# Patient Record
Sex: Male | Born: 1975 | Race: White | Hispanic: No | Marital: Married | Smoking: Current every day smoker
Health system: Southern US, Community
[De-identification: ages and names within clinical notes are randomized; demographics above are authoritative.]

## PROBLEM LIST (undated history)

## (undated) DIAGNOSIS — Z720 Tobacco use: Secondary | ICD-10-CM

## (undated) DIAGNOSIS — I255 Ischemic cardiomyopathy: Secondary | ICD-10-CM

## (undated) DIAGNOSIS — E785 Hyperlipidemia, unspecified: Secondary | ICD-10-CM

## (undated) DIAGNOSIS — I2510 Atherosclerotic heart disease of native coronary artery without angina pectoris: Secondary | ICD-10-CM

## (undated) HISTORY — PX: OTHER SURGICAL HISTORY: SHX169

## (undated) HISTORY — DX: Tobacco use: Z72.0

## (undated) HISTORY — DX: Ischemic cardiomyopathy: I25.5

## (undated) HISTORY — DX: Atherosclerotic heart disease of native coronary artery without angina pectoris: I25.10

## (undated) HISTORY — DX: Hyperlipidemia, unspecified: E78.5

## (undated) HISTORY — PX: NECK SURGERY: SHX720

---

## 1997-12-08 ENCOUNTER — Emergency Department (HOSPITAL_COMMUNITY): Admission: EM | Admit: 1997-12-08 | Discharge: 1997-12-08 | Payer: Self-pay | Admitting: Emergency Medicine

## 1997-12-10 ENCOUNTER — Encounter: Admission: RE | Admit: 1997-12-10 | Discharge: 1997-12-10 | Payer: Self-pay | Admitting: *Deleted

## 1997-12-12 ENCOUNTER — Emergency Department (HOSPITAL_COMMUNITY): Admission: EM | Admit: 1997-12-12 | Discharge: 1997-12-12 | Payer: Self-pay | Admitting: Emergency Medicine

## 1998-10-23 ENCOUNTER — Encounter: Admission: RE | Admit: 1998-10-23 | Discharge: 1998-11-01 | Payer: Self-pay | Admitting: *Deleted

## 2000-01-03 ENCOUNTER — Encounter: Payer: Self-pay | Admitting: Emergency Medicine

## 2000-01-03 ENCOUNTER — Emergency Department (HOSPITAL_COMMUNITY): Admission: EM | Admit: 2000-01-03 | Discharge: 2000-01-03 | Payer: Self-pay | Admitting: Emergency Medicine

## 2001-10-19 ENCOUNTER — Emergency Department (HOSPITAL_COMMUNITY): Admission: EM | Admit: 2001-10-19 | Discharge: 2001-10-19 | Payer: Self-pay | Admitting: Emergency Medicine

## 2001-10-19 ENCOUNTER — Encounter: Payer: Self-pay | Admitting: Emergency Medicine

## 2002-07-18 ENCOUNTER — Emergency Department (HOSPITAL_COMMUNITY): Admission: EM | Admit: 2002-07-18 | Discharge: 2002-07-18 | Payer: Self-pay | Admitting: Emergency Medicine

## 2004-04-27 ENCOUNTER — Emergency Department (HOSPITAL_COMMUNITY): Admission: EM | Admit: 2004-04-27 | Discharge: 2004-04-27 | Payer: Self-pay | Admitting: *Deleted

## 2004-04-30 ENCOUNTER — Emergency Department (HOSPITAL_COMMUNITY): Admission: EM | Admit: 2004-04-30 | Discharge: 2004-04-30 | Payer: Self-pay | Admitting: Emergency Medicine

## 2004-07-01 ENCOUNTER — Emergency Department (HOSPITAL_COMMUNITY): Admission: EM | Admit: 2004-07-01 | Discharge: 2004-07-01 | Payer: Self-pay | Admitting: Emergency Medicine

## 2004-12-09 ENCOUNTER — Emergency Department (HOSPITAL_COMMUNITY): Admission: EM | Admit: 2004-12-09 | Discharge: 2004-12-09 | Payer: Self-pay | Admitting: Emergency Medicine

## 2005-05-18 ENCOUNTER — Emergency Department (HOSPITAL_COMMUNITY): Admission: EM | Admit: 2005-05-18 | Discharge: 2005-05-18 | Payer: Self-pay | Admitting: Emergency Medicine

## 2005-07-07 ENCOUNTER — Emergency Department (HOSPITAL_COMMUNITY): Admission: EM | Admit: 2005-07-07 | Discharge: 2005-07-07 | Payer: Self-pay | Admitting: *Deleted

## 2005-11-29 ENCOUNTER — Emergency Department (HOSPITAL_COMMUNITY): Admission: EM | Admit: 2005-11-29 | Discharge: 2005-11-29 | Payer: Self-pay | Admitting: Emergency Medicine

## 2006-01-19 ENCOUNTER — Emergency Department (HOSPITAL_COMMUNITY): Admission: EM | Admit: 2006-01-19 | Discharge: 2006-01-19 | Payer: Self-pay | Admitting: Emergency Medicine

## 2006-09-04 ENCOUNTER — Emergency Department (HOSPITAL_COMMUNITY): Admission: EM | Admit: 2006-09-04 | Discharge: 2006-09-04 | Payer: Self-pay | Admitting: Emergency Medicine

## 2006-11-12 ENCOUNTER — Emergency Department (HOSPITAL_COMMUNITY): Admission: EM | Admit: 2006-11-12 | Discharge: 2006-11-12 | Payer: Self-pay | Admitting: Emergency Medicine

## 2007-01-10 ENCOUNTER — Emergency Department (HOSPITAL_COMMUNITY): Admission: EM | Admit: 2007-01-10 | Discharge: 2007-01-10 | Payer: Self-pay | Admitting: Emergency Medicine

## 2007-06-13 ENCOUNTER — Emergency Department (HOSPITAL_COMMUNITY): Admission: EM | Admit: 2007-06-13 | Discharge: 2007-06-13 | Payer: Self-pay | Admitting: Family Medicine

## 2007-11-27 ENCOUNTER — Emergency Department (HOSPITAL_COMMUNITY): Admission: EM | Admit: 2007-11-27 | Discharge: 2007-11-27 | Payer: Self-pay | Admitting: Emergency Medicine

## 2008-02-14 ENCOUNTER — Emergency Department (HOSPITAL_COMMUNITY): Admission: EM | Admit: 2008-02-14 | Discharge: 2008-02-14 | Payer: Self-pay | Admitting: Emergency Medicine

## 2008-05-18 ENCOUNTER — Emergency Department (HOSPITAL_COMMUNITY): Admission: EM | Admit: 2008-05-18 | Discharge: 2008-05-18 | Payer: Self-pay | Admitting: Family Medicine

## 2008-12-23 ENCOUNTER — Emergency Department (HOSPITAL_BASED_OUTPATIENT_CLINIC_OR_DEPARTMENT_OTHER): Admission: EM | Admit: 2008-12-23 | Discharge: 2008-12-23 | Payer: Self-pay | Admitting: Emergency Medicine

## 2009-04-09 ENCOUNTER — Emergency Department (HOSPITAL_BASED_OUTPATIENT_CLINIC_OR_DEPARTMENT_OTHER): Admission: EM | Admit: 2009-04-09 | Discharge: 2009-04-09 | Payer: Self-pay | Admitting: Emergency Medicine

## 2009-07-08 ENCOUNTER — Emergency Department (HOSPITAL_BASED_OUTPATIENT_CLINIC_OR_DEPARTMENT_OTHER): Admission: EM | Admit: 2009-07-08 | Discharge: 2009-07-08 | Payer: Self-pay | Admitting: Emergency Medicine

## 2009-09-27 ENCOUNTER — Emergency Department (HOSPITAL_COMMUNITY): Admission: EM | Admit: 2009-09-27 | Discharge: 2009-09-27 | Payer: Self-pay | Admitting: Emergency Medicine

## 2010-02-24 ENCOUNTER — Emergency Department (HOSPITAL_COMMUNITY): Admission: EM | Admit: 2010-02-24 | Discharge: 2010-02-24 | Payer: Self-pay | Admitting: Advanced Practice Midwife

## 2010-08-28 LAB — COMPREHENSIVE METABOLIC PANEL
BUN: 4 mg/dL — ABNORMAL LOW (ref 6–23)
GFR calc Af Amer: >60 mL/min (ref >60)
Glucose, Bld: 101 mg/dL — ABNORMAL HIGH (ref 70–99)
Potassium: 4.6 mEq/L (ref 3.5–5.1)
Total Protein: 8.2 g/dL (ref 6.0–8.3)

## 2010-08-28 LAB — DIFFERENTIAL
Basophils Absolute: 0 10*3/uL (ref 0.0–0.1)
Basophils Relative: 0 % (ref 0–1)
Eosinophils Relative: 1 % (ref 0–5)
Lymphocytes Relative: 10 % — ABNORMAL LOW (ref 12–46)
Lymphs Abs: 0.9 10*3/uL (ref 0.7–4.0)
Monocytes Absolute: 0.6 10*3/uL (ref 0.1–1.0)
Monocytes Relative: 6 % (ref 3–12)
Neutro Abs: 8.2 10*3/uL — ABNORMAL HIGH (ref 1.7–7.7)
Neutrophils Relative %: 84 % — ABNORMAL HIGH (ref 43–77)

## 2010-08-28 LAB — CULTURE, BLOOD (ROUTINE X 2)
Culture: NO GROWTH
Culture: NO GROWTH

## 2010-08-28 LAB — CBC
HCT: 48.3 % (ref 39.0–52.0)
Hemoglobin: 17 g/dL (ref 13.0–17.0)
MCH: 32.5 pg (ref 26.0–34.0)
RBC: 5.24 MIL/uL (ref 4.22–5.81)
WBC: 9.8 10*3/uL (ref 4.0–10.5)

## 2010-08-28 LAB — POCT CARDIAC MARKERS: Troponin i, poc: <0.05 ng/mL (ref 0.00–0.09)

## 2010-09-02 LAB — POCT CARDIAC MARKERS
Myoglobin, poc: 81.7 ng/mL (ref 12–200)
Troponin i, poc: <0.05 ng/mL (ref 0.00–0.09)

## 2010-12-31 ENCOUNTER — Emergency Department (HOSPITAL_BASED_OUTPATIENT_CLINIC_OR_DEPARTMENT_OTHER)
Admission: EM | Admit: 2010-12-31 | Discharge: 2011-01-01 | Discharge status: Against Medical Advice | Payer: Self-pay | Attending: Emergency Medicine | Admitting: Emergency Medicine

## 2010-12-31 ENCOUNTER — Emergency Department (HOSPITAL_COMMUNITY)
Admission: EM | Admit: 2010-12-31 | Discharge: 2010-12-31 | Disposition: A | Discharge status: Home / Self Care | Payer: Self-pay | Attending: Emergency Medicine | Admitting: Emergency Medicine

## 2010-12-31 ENCOUNTER — Encounter: Payer: Self-pay | Admitting: *Deleted

## 2010-12-31 DIAGNOSIS — K089 Disorder of teeth and supporting structures, unspecified: Secondary | ICD-10-CM | POA: Insufficient documentation

## 2010-12-31 NOTE — ED Notes (Signed)
Pt c/o toothache x 4 days.   

## 2010-12-31 NOTE — ED Notes (Signed)
Pt c/o toothache

## 2011-06-29 ENCOUNTER — Encounter (HOSPITAL_COMMUNITY): Payer: Self-pay | Admitting: Emergency Medicine

## 2011-06-29 ENCOUNTER — Emergency Department (HOSPITAL_COMMUNITY)
Admission: EM | Admit: 2011-06-29 | Discharge: 2011-06-29 | Disposition: A | Discharge status: Home / Self Care | Payer: Self-pay | Attending: Emergency Medicine | Admitting: Emergency Medicine

## 2011-06-29 DIAGNOSIS — F17200 Nicotine dependence, unspecified, uncomplicated: Secondary | ICD-10-CM | POA: Insufficient documentation

## 2011-06-29 DIAGNOSIS — K047 Periapical abscess without sinus: Secondary | ICD-10-CM

## 2011-06-29 DIAGNOSIS — K0889 Other specified disorders of teeth and supporting structures: Secondary | ICD-10-CM

## 2011-06-29 DIAGNOSIS — K089 Disorder of teeth and supporting structures, unspecified: Secondary | ICD-10-CM | POA: Insufficient documentation

## 2011-06-29 MED ORDER — PENICILLIN V POTASSIUM 500 MG PO TABS: 500.0000 mg | ORAL_TABLET | Freq: Four times a day (QID) | ORAL | Status: AC

## 2011-06-29 MED ORDER — KETOROLAC TROMETHAMINE 60 MG/2ML IM SOLN
60.0000 mg | Freq: Once | INTRAMUSCULAR | Status: AC
  Administered 2011-06-29: 60 mg via INTRAMUSCULAR
  Filled 2011-06-29: qty 2

## 2011-06-29 MED ORDER — PENICILLIN V POTASSIUM 500 MG PO TABS
500.0000 mg | ORAL_TABLET | Freq: Once | ORAL | Status: AC
  Administered 2011-06-29: 500 mg via ORAL
  Filled 2011-06-29: qty 1

## 2011-06-29 MED ORDER — OXYCODONE-ACETAMINOPHEN 5-325 MG PO TABS: 1.0000 | ORAL_TABLET | ORAL | Status: AC | PRN

## 2011-06-29 NOTE — ED Notes (Signed)
Pt presented to the ER with c/o dental pain, states started 3 days ago, pt took some pt medications while at home, however, effect decreased, and pain worsen. Pt showing to his right lower side.

## 2011-06-29 NOTE — ED Provider Notes (Addendum)
History     CSN: [id number]  Arrival date & time 06/29/11  0414   First MD Initiated Contact with Patient 06/29/11 [phone]      Chief Complaint  Patient presents with  . Dental Pain    (Consider location/radiation/quality/duration/timing/severity/associated sxs/prior treatment) HPI Comments: Gradual onset of lower dental pain for the last week. He is known to have severe dental disease and has had to have all of his upper teeth extracted, most of his molars and premolars extracted. Symptoms were gradual in onset, persistent, worse with palpation, not associated with fevers. He has had hydrocodone prior to arrival with minimal improvement. He does not evident  The history is provided by the patient and the spouse.    History reviewed. No pertinent past medical history.  Past Surgical History  Procedure Date  . Neck surgery     "whole in the throat" fixed    History reviewed. No pertinent family history.  History  Substance Use Topics  . Smoking status: Current Everyday Smoker -- 0.5 packs/day  . Smokeless tobacco: Not on file  . Alcohol Use: No      Review of Systems  Constitutional: Negative for fever and chills.  HENT: Positive for dental problem. Negative for sore throat, facial swelling, trouble swallowing and voice change.        Toothache  Gastrointestinal: Negative for nausea and vomiting.    Allergies  Review of patient's allergies indicates no known allergies.  Home Medications   Current Outpatient Rx  Name Route Sig Dispense Refill  . HYDROCODONE-ACETAMINOPHEN 5-325 MG PO TABS Oral Take 2 tablets by mouth every 6 (six) hours as needed. Pain    . OXYCODONE-ACETAMINOPHEN 5-325 MG PO TABS Oral Take 1 tablet by mouth every 4 (four) hours as needed for pain. May take 2 tablets PO q 6 hours for severe pain - Do not take with Tylenol as this tablet already contains tylenol 15 tablet 0  . PENICILLIN V POTASSIUM 500 MG PO TABS Oral Take 1 tablet (500 mg total) by  mouth 4 (four) times daily. 40 tablet 0    BP 131/78  Pulse 85  Temp(Src) 98 F (36.7 C) (Oral)  Resp 18  SpO2 98%  Physical Exam  Nursing note and vitals reviewed. Constitutional: He appears well-developed and well-nourished. No distress.  HENT:  Head: Normocephalic and atraumatic.  Mouth/Throat: Oropharynx is clear and moist. No oropharyngeal exudate.       Dental Disease, diffuse terrible appearing teeth, deep caries, only the front central and lateral incisors and canines present, no periapical abscesses visualized, no fluctuance or induration. No external swelling of the jaw. Able to open his mouth without any trismus.  Eyes: Conjunctivae are normal. No scleral icterus.  Neck: Normal range of motion. Neck supple. No thyromegaly present.  Cardiovascular: Normal rate and regular rhythm.   Pulmonary/Chest: Effort normal and breath sounds normal.  Lymphadenopathy:    He has no cervical adenopathy.  Neurological: He is alert.  Skin: Skin is warm and dry. No rash noted. He is not diaphoretic.    ED Course  Procedures (including critical care time)  Labs Reviewed - No data to display No results found.   1. Toothache   2. Dental abscess       MDM  Toradol intramuscular given, penicillin given, home with prescriptions and followup with dental surgery. The phone number. followup instructions given including fever swelling pain and vomiting to return to the emergency department.  Discharge Prescriptions include:  #1  Percocet #2 penicillin        Vida Roller, MD 06/29/11 1308  Vida Roller, MD 06/29/11 [phone]

## 2011-06-30 ENCOUNTER — Encounter (HOSPITAL_COMMUNITY): Payer: Self-pay | Admitting: *Deleted

## 2011-06-30 ENCOUNTER — Emergency Department (HOSPITAL_COMMUNITY)
Admission: EM | Admit: 2011-06-30 | Discharge: 2011-06-30 | Disposition: A | Discharge status: Home / Self Care | Payer: Self-pay | Attending: Emergency Medicine | Admitting: Emergency Medicine

## 2011-06-30 DIAGNOSIS — R6250 Unspecified lack of expected normal physiological development in childhood: Secondary | ICD-10-CM | POA: Insufficient documentation

## 2011-06-30 DIAGNOSIS — K089 Disorder of teeth and supporting structures, unspecified: Secondary | ICD-10-CM | POA: Insufficient documentation

## 2011-06-30 DIAGNOSIS — R6884 Jaw pain: Secondary | ICD-10-CM | POA: Insufficient documentation

## 2011-06-30 DIAGNOSIS — K0262 Dental caries on smooth surface penetrating into dentin: Secondary | ICD-10-CM | POA: Insufficient documentation

## 2011-06-30 MED ORDER — KETOROLAC TROMETHAMINE 60 MG/2ML IM SOLN
60.0000 mg | Freq: Once | INTRAMUSCULAR | Status: AC
  Administered 2011-06-30: 60 mg via INTRAMUSCULAR
  Filled 2011-06-30: qty 2

## 2011-06-30 MED ORDER — OXYCODONE-ACETAMINOPHEN 5-325 MG PO TABS: 2.0000 | ORAL_TABLET | ORAL | Status: AC | PRN

## 2011-06-30 MED ORDER — HYDROMORPHONE HCL PF 2 MG/ML IJ SOLN
2.0000 mg | Freq: Once | INTRAMUSCULAR | Status: AC
  Administered 2011-06-30: 2 mg via INTRAMUSCULAR
  Filled 2011-06-30: qty 1

## 2011-06-30 MED ORDER — IBUPROFEN 800 MG PO TABS: 800.0000 mg | ORAL_TABLET | Freq: Three times a day (TID) | ORAL | Status: AC

## 2011-06-30 NOTE — ED Provider Notes (Signed)
Medical screening examination/treatment/procedure(s) were performed by non-physician practitioner and as supervising physician I was immediately available for consultation/collaboration. Devoria Albe, MD, Armando Gang   Ward Givens, MD 06/30/11 [phone]

## 2011-06-30 NOTE — ED Provider Notes (Signed)
History     CSN: [id number]  Arrival date & time 06/30/11  0102   First MD Initiated Contact with Patient 06/30/11 2004      Chief Complaint  Patient presents with  . Dental Pain    (Consider location/radiation/quality/duration/timing/severity/associated sxs/prior treatment) Patient is a 36 y.o. male presenting with tooth pain. The history is provided by the patient and a parent. The history is limited by a developmental delay.  Dental PainThe primary symptoms include mouth pain. Primary symptoms do not include fever. Primary symptoms comment: He is scheduled to have remaining teeth pulled by Dr. Teola Bradley but pain relievers at home not helping. The symptoms began 3 to 5 days ago. The symptoms are unchanged. The symptoms occur constantly.  Additional symptoms include: jaw pain. Additional symptoms do not include: facial swelling.    History reviewed. No pertinent past medical history.  Past Surgical History  Procedure Date  . Neck surgery     "whole in the throat" fixed    History reviewed. No pertinent family history.  History  Substance Use Topics  . Smoking status: Current Everyday Smoker -- 0.5 packs/day  . Smokeless tobacco: Not on file  . Alcohol Use: No      Review of Systems  Constitutional: Negative for fever and chills.  HENT: Negative for facial swelling.        Complains of lower anterior jaw pain.  Gastrointestinal: Negative.   Skin: Negative.     Allergies  Penicillins  Home Medications   Current Outpatient Rx  Name Route Sig Dispense Refill  . IBUPROFEN 200 MG PO TABS Oral Take 200 mg by mouth every 6 (six) hours as needed.    . OXYCODONE-ACETAMINOPHEN 5-325 MG PO TABS Oral Take 1 tablet by mouth every 4 (four) hours as needed for pain. May take 2 tablets PO q 6 hours for severe pain - Do not take with Tylenol as this tablet already contains tylenol 15 tablet 0  . PENICILLIN V POTASSIUM 500 MG PO TABS Oral Take 1 tablet (500 mg total) by mouth 4  (four) times daily. 40 tablet 0    BP 133/78  Pulse 67  Temp(Src) 98 F (36.7 C) (Oral)  Resp 20  Physical Exam  Constitutional: He is oriented to person, place, and time. He appears well-developed and well-nourished.  HENT:       Only remaining teeth are lower incisors with severe erosion. No swelling. Malodorous. No facial swelling.  Neck: Normal range of motion.  Pulmonary/Chest: Effort normal.  Musculoskeletal: Normal range of motion.  Neurological: He is alert and oriented to person, place, and time.  Skin: Skin is warm and dry.  Psychiatric: He has a normal mood and affect.    ED Course  Procedures (including critical care time)  Labs Reviewed - No data to display No results found.   No diagnosis found.    MDM  The patient has appropriate plan in place for treatment. Will add ibuprofen to pain regimen, refill percocet and encourage further pain management with Dr. Barbette Merino.        Rodena Medin, PA-C 06/30/11 2021

## 2011-06-30 NOTE — ED Notes (Signed)
Pt in c/o toothache

## 2011-06-30 NOTE — ED Notes (Signed)
Pt states he is having trouble sleeping at night due to pain.pt states pain medication for his teeth is not working.pt states he can not have them remove until feb

## 2012-08-15 ENCOUNTER — Ambulatory Visit (INDEPENDENT_AMBULATORY_CARE_PROVIDER_SITE_OTHER): Payer: BC Managed Care – PPO | Admitting: Emergency Medicine

## 2012-08-15 VITALS — BP 135/91 | HR 78 | Temp 97.3°F | Resp 20 | Ht 70.0 in | Wt 162.0 lb

## 2012-08-15 DIAGNOSIS — M549 Dorsalgia, unspecified: Secondary | ICD-10-CM

## 2012-08-15 DIAGNOSIS — S335XXA Sprain of ligaments of lumbar spine, initial encounter: Secondary | ICD-10-CM

## 2012-08-15 LAB — POCT UA - MICROSCOPIC ONLY
Casts, Ur, LPF, POC: NEGATIVE
Epithelial cells, urine per micros: NEGATIVE
Yeast, UA: NEGATIVE

## 2012-08-15 LAB — POCT URINALYSIS DIPSTICK
Bilirubin, UA: NEGATIVE
Glucose, UA: NEGATIVE
Protein, UA: NEGATIVE
pH, UA: 6

## 2012-08-15 MED ORDER — CYCLOBENZAPRINE HCL 10 MG PO TABS: 10.0000 mg | ORAL_TABLET | Freq: Three times a day (TID) | ORAL | Status: DC | PRN

## 2012-08-15 MED ORDER — NAPROXEN SODIUM 550 MG PO TABS: 550.0000 mg | ORAL_TABLET | Freq: Two times a day (BID) | ORAL | Status: AC

## 2012-08-15 NOTE — Patient Instructions (Addendum)

## 2012-08-15 NOTE — Progress Notes (Signed)
[facility] [address], [place] [address] [phone]  Date:  08/15/2012   Name:  Martin Johnston   DOB:  10-10-1975   MRN:  [id number]  PCP:  No primary provider on file.    Chief Complaint: Back Pain   History of Present Illness:  Martin Johnston is a 37 y.o. very pleasant male patient who presents with the following:  Sudden onset of right flank pain last night that has worsened since.  Comes across the back to the left at times.  Waxes and wanes but never gone.  No fever or chills, nausea or vomiting.  No GI or GU symptoms.  No improvement with over the counter medications or other home remedies. No history of kidney stones.    There is no problem list on file for this patient.   History reviewed. No pertinent past medical history.  Past Surgical History  Procedure Laterality Date  . Neck surgery      "whole in the throat" fixed    History  Substance Use Topics  . Smoking status: Current Every Day Smoker -- 0.50 packs/day  . Smokeless tobacco: Not on file  . Alcohol Use: No    Family History  Problem Relation Age of Onset  . Cancer Mother   . Heart disease Father   . Diabetes Father     Allergies  Allergen Reactions  . Penicillins     UPSET STOMACH  . Vicodin (Hydrocodone-Acetaminophen)     Medication list has been reviewed and updated.  Current Outpatient Prescriptions on File Prior to Visit  Medication Sig Dispense Refill  . ibuprofen (ADVIL,MOTRIN) 200 MG tablet Take 200 mg by mouth every 6 (six) hours as needed.       No current facility-administered medications on file prior to visit.    Review of Systems:  As per HPI, otherwise negative.    Physical Examination: Filed Vitals:   08/15/12 1216  BP: 135/91  Pulse: 78  Temp: 97.3 F (36.3 C)  Resp: 20   Filed Vitals:   08/15/12 1216  Height: 5\' 10"  (1.778 m)  Weight: 162 lb (73.483 kg)   Body mass index is 23.24 kg/(m^2). Ideal Body Weight: Weight  in (lb) to have BMI = 25: 173.9  GEN: WDWN, NAD, Non-toxic, A & O x 3 HEENT: Atraumatic, Normocephalic. Neck supple. No masses, No LAD. Ears and Nose: No external deformity. CV: RRR, No M/G/R. No JVD. No thrill. No extra heart sounds. PULM: CTA B, no wheezes, crackles, rhonchi. No retractions. No resp. distress. No accessory muscle use. ABD: S, tender over right kidney, ND, +BS. No rebound. No HSM. EXTR: No c/c/e NEURO Normal gait.  PSYCH: Normally interactive. Conversant. Not depressed or anxious appearing.  Calm demeanor.  Back:  Tender right CVA  Assessment and Plan: Back pain  Lumbar strain Anaprox Flexeril Follow up as needed  Carmelina Dane, MD   Results for orders placed in visit on 08/15/12  POCT UA - MICROSCOPIC ONLY      Result Value Range   WBC, Ur, HPF, POC neg     RBC, urine, microscopic neg     Bacteria, U Microscopic trace     Mucus, UA trace     Epithelial cells, urine per micros neg     Crystals, Ur, HPF, POC neg     Casts, Ur, LPF, POC neg     Yeast, UA neg    POCT URINALYSIS DIPSTICK  Result Value Range   Color, UA yellow     Clarity, UA clear     Glucose, UA neg     Bilirubin, UA neg     Ketones, UA neg     Spec Grav, UA 1.015     Blood, UA neg     pH, UA 6.0     Protein, UA neg     Urobilinogen, UA 0.2     Nitrite, UA neg     Leukocytes, UA Negative

## 2014-10-26 ENCOUNTER — Ambulatory Visit (INDEPENDENT_AMBULATORY_CARE_PROVIDER_SITE_OTHER): Payer: Worker's Compensation | Admitting: Family Medicine

## 2014-10-26 ENCOUNTER — Ambulatory Visit: Payer: Worker's Compensation

## 2014-10-26 VITALS — BP 116/80 | HR 77 | Temp 97.7°F | Resp 18 | Ht 70.0 in | Wt 164.0 lb

## 2014-10-26 DIAGNOSIS — R0789 Other chest pain: Secondary | ICD-10-CM

## 2014-10-26 DIAGNOSIS — S29011A Strain of muscle and tendon of front wall of thorax, initial encounter: Secondary | ICD-10-CM

## 2014-10-26 DIAGNOSIS — R079 Chest pain, unspecified: Secondary | ICD-10-CM | POA: Diagnosis not present

## 2014-10-26 MED ORDER — MELOXICAM 7.5 MG PO TABS: 7.5000 mg | ORAL_TABLET | Freq: Every day | ORAL | Status: DC

## 2014-10-26 NOTE — Progress Notes (Addendum)
Subjective:  This chart was scribed for Martin StaggersJeffrey Jamont Mellin, MD by Martin Johnston, Medial Scribe. This patient was seen in room 2 and the patient's care was started at 8:47 AM.    Patient ID: Martin Johnston, male    DOB: Dec 04, 1975, 39 y.o.   MRN: [id number] Chief Complaint  Patient presents with  . Chest Pain    push injury at work  . Rib Injury    Chest Pain  Pertinent negatives include no abdominal pain, back pain, diaphoresis, fever, nausea, shortness of breath, vomiting or weakness.   HPI Comments: Martin Johnston is a 39 y.o. male who presents [facility] for evaluation of injury that occurred at work this morning. Patient works in [employer]; he reports injury sustained to left lower chest wall after removing an AC unit. Patient denies audible pop, pull or snap or pain during the activity, but reports development of pain shortly after. Patient reports exacerbated pain with movement of his left shoulder and arm and with deep inspiration. Patient denies radiation of pain into his left shoulder. Patient denies treatment prior to arrival. Patient reports tearing a muscle in his left chest 10 years ago after using a car jack. Patient denies later complications reporting full recovery from his injury.  Patient is an admitted smoker. Patient shares family of cardiac disease in his father; he is uncertain the age of onset. No alcohol, no known hx of PUD.   Prior to Admission medications   Medication Sig Start Date End Date Taking? Authorizing Provider  cyclobenzaprine (FLEXERIL) 10 MG tablet Take 1 tablet (10 mg total) by mouth 3 (three) times daily as needed for muscle spasms. Patient not taking: Reported on 10/26/2014 08/15/12   Carmelina DaneJeffery S Anderson, MD  ibuprofen (ADVIL,MOTRIN) 200 MG tablet Take 200 mg by mouth every 6 (six) hours as needed.    Historical Provider, MD   Allergies  Allergen Reactions  . Penicillins     UPSET STOMACH  . Vicodin  [Hydrocodone-Acetaminophen]      Review of Systems  Constitutional: Negative for fever and diaphoresis.  Respiratory: Negative for shortness of breath.   Cardiovascular: Positive for chest pain.  Gastrointestinal: Negative for nausea, vomiting and abdominal pain.  Musculoskeletal: Negative for back pain and neck pain.  Skin: Negative for color change and wound.  Neurological: Negative for weakness.       Objective:   Physical Exam  Constitutional: He is oriented to person, place, and time. He appears well-developed and well-nourished. No distress.  HENT:  Head: Normocephalic and atraumatic.  Eyes: EOM are normal. Pupils are equal, round, and reactive to light.  Neck: Neck supple. No JVD present. Carotid bruit is not present. No tracheal deviation present.  C spine full ROM without pain.   Cardiovascular: Normal rate, regular rhythm and normal heart sounds.   No murmur heard. Pulmonary/Chest: Effort normal and breath sounds normal. No respiratory distress. He has no rales.  Splinted inspiration, but he is clear and speaking in full sentences. No apparent pectoralis defect. Slight tenderness along left upper chest wall. No crepitus.    Abdominal: There is no tenderness.  Musculoskeletal: Normal range of motion. He exhibits no edema.  Left shoulder: AC and  joints are nontender. Clavicle nontender. Full ROM of left shoulder. Equal rotator cuff strength.    Neurological: He is alert and oriented to person, place, and time.  Skin: Skin is warm and dry.  Psychiatric: He has a normal mood and  affect. His behavior is normal.  Nursing note and vitals reviewed.   Filed Vitals:   10/26/14 0828  BP: 116/80  Pulse: 77  Temp: 97.7 F (36.5 C)  TempSrc: Oral  Resp: 18  Height: 5\' 10"  (1.778 m)  Weight: 164 lb (74.39 kg)  SpO2: 96%     [facility] reading (PRIMARY) by  Dr. Neva SeatGreene: L rib series:no pneumothorax, no apparent fracture.   EKG: sinus rhythm, no acute findings.       Assessment & Plan:   Martin Johnston is a 39 y.o. male Left sided chest pain - Plan: EKG 12-Lead, DG Ribs Unilateral W/Chest Left, meloxicam (MOBIC) 7.5 MG tablet  Chest wall pain - Plan: EKG 12-Lead, DG Ribs Unilateral W/Chest Left, meloxicam (MOBIC) 7.5 MG tablet  Chest wall muscle strain, initial encounter - Plan: meloxicam (MOBIC) 7.5 MG tablet  L chest wall strain d/t injury at work today.  No apparent rib fracture or concerns on EKG/CXR.  Work restrictions, trial of Mobic, relative rest of affected area and work restrictions and recheck in 5 days. Sooner if worse and chest pain/ER precautions given.    Meds ordered this encounter  Medications  . meloxicam (MOBIC) 7.5 MG tablet    Sig: Take 1 tablet (7.5 mg total) by mouth daily.    Dispense:  30 tablet    Refill:  0   Patient Instructions  You likely have a strained muscle in the chest wall. Try the mobic each morning (do not combine with other over the counter pain relievers), heat or ice to area as needed and avoid lifting on that side for now. Recheck next week as discussed. Return to the clinic or go to the nearest emergency room if any of your symptoms worsen or new symptoms occur.   Chest Wall Pain Chest wall pain is pain felt in or around the chest bones and muscles. It may take up to 6 weeks to get better. It may take longer if you are active. Chest wall pain can happen on its own. Other times, things like germs, injury, coughing, or exercise can cause the pain. HOME CARE   Avoid activities that make you tired or cause pain. Try not to use your chest, belly (abdominal), or side muscles. Do not use heavy weights.  Put ice on the sore area.  Put ice in a plastic bag.  Place a towel between your skin and the bag.  Leave the ice on for 15-20 minutes for the first 2 days.  Only take medicine as told by your doctor. GET HELP RIGHT AWAY IF:   You have more pain or are very uncomfortable.  You have a  fever.  Your chest pain gets worse.  You have new problems.  You feel sick to your stomach (nauseous) or throw up (vomit).  You start to sweat or feel lightheaded.  You have a cough with mucus (phlegm).  You cough up blood. MAKE SURE YOU:   Understand these instructions.  Will watch your condition.  Will get help right away if you are not doing well or get worse. Document Released: 11/18/2007 Document Revised: 08/24/2011 Document Reviewed: 01/26/2011 [facility] Patient Information 2015 [place], [place]. This information is not intended to replace advice given to you by your health care provider. Make sure you discuss any questions you have with your health care provider.     \  I personally performed the services described in this documentation, which was scribed in my presence. The recorded information  has been reviewed and considered, and addended by me as needed.

## 2014-10-26 NOTE — Patient Instructions (Signed)
You likely have a strained muscle in the chest wall. Try the mobic each morning (do not combine with other over the counter pain relievers), heat or ice to area as needed and avoid lifting on that side for now. Recheck next week as discussed. Return to the clinic or go to the nearest emergency room if any of your symptoms worsen or new symptoms occur.   Chest Wall Pain Chest wall pain is pain felt in or around the chest bones and muscles. It may take up to 6 weeks to get better. It may take longer if you are active. Chest wall pain can happen on its own. Other times, things like germs, injury, coughing, or exercise can cause the pain. HOME CARE   Avoid activities that make you tired or cause pain. Try not to use your chest, belly (abdominal), or side muscles. Do not use heavy weights.  Put ice on the sore area.  Put ice in a plastic bag.  Place a towel between your skin and the bag.  Leave the ice on for 15-20 minutes for the first 2 days.  Only take medicine as told by your doctor. GET HELP RIGHT AWAY IF:   You have more pain or are very uncomfortable.  You have a fever.  Your chest pain gets worse.  You have new problems.  You feel sick to your stomach (nauseous) or throw up (vomit).  You start to sweat or feel lightheaded.  You have a cough with mucus (phlegm).  You cough up blood. MAKE SURE YOU:   Understand these instructions.  Will watch your condition.  Will get help right away if you are not doing well or get worse. Document Released: 11/18/2007 Document Revised: 08/24/2011 Document Reviewed: 01/26/2011 [facility]Exit[facility] Patient Information 2015 [place], [place]. This information is not intended to replace advice given to you by your health care provider. Make sure you discuss any questions you have with your health care provider.

## 2014-10-31 ENCOUNTER — Ambulatory Visit (INDEPENDENT_AMBULATORY_CARE_PROVIDER_SITE_OTHER): Payer: Worker's Compensation | Admitting: Family Medicine

## 2014-10-31 VITALS — BP 132/78 | HR 75 | Temp 98.7°F | Resp 16 | Ht 71.0 in | Wt 169.4 lb

## 2014-10-31 DIAGNOSIS — R0789 Other chest pain: Secondary | ICD-10-CM | POA: Diagnosis not present

## 2014-10-31 NOTE — Progress Notes (Addendum)
Subjective:    Patient ID: Martin Johnston, male    DOB: January 21, 1976, 39 y.o.   MRN: [id number] This chart was scribed for Meredith StaggersJeffrey Luby Seamans, MD by Leona CarryG. Clay Sherrill, ED Scribe. The patient was seen in Room 14. The patient's care was started at 8:13 PM.   HPI Martin Johnston is a 39 y.o. male who is here for a follow up on an injury that occurred while moving an [employer] unit at work on May 13th. The patient suffered a chest wall strain and left-sided chest pain. X-ray did not indicate any apparent chest fractures. He was treated Meloxicam 7.5 mg QD as needed. He was advised to use heat or ice to affected area and avoid lifting.   Patient reports that he experienced soreness approximately 5 days ago, the morning after the injury occurred. He is no longer experiencing any pain. He states that he last experienced soreness yesterday.     Allergies  Allergen Reactions  . Penicillins     UPSET STOMACH  . Vicodin [Hydrocodone-Acetaminophen]    Prior to Admission medications   Medication Sig Start Date End Date Taking? Authorizing Provider  cyclobenzaprine (FLEXERIL) 10 MG tablet Take 1 tablet (10 mg total) by mouth 3 (three) times daily as needed for muscle spasms. 08/15/12  Yes Carmelina DaneJeffery S Anderson, MD  meloxicam (MOBIC) 7.5 MG tablet Take 1 tablet (7.5 mg total) by mouth daily. 10/26/14  Yes Shade FloodJeffrey R Leonor Darnell, MD    Review of Systems  Musculoskeletal: Negative for myalgias and arthralgias.       Objective:   Physical Exam  Constitutional: He is oriented to person, place, and time. He appears well-developed and well-nourished. No distress.  HENT:  Head: Normocephalic and atraumatic.  Eyes: Conjunctivae and EOM are normal.  Neck: Neck supple. No tracheal deviation present.  Cardiovascular: Normal rate, regular rhythm and normal heart sounds.   Pulmonary/Chest: Effort normal and breath sounds normal. No respiratory distress. He has no wheezes. He has no rales. He exhibits no tenderness.    Musculoskeletal: Normal range of motion.  Full ROM of left shoulder. Does not reproduce pain. No pain in pectoralis with resisted butterfly testing.  Neurological: He is alert and oriented to person, place, and time.  Skin: Skin is warm and dry. No rash noted.  Psychiatric: He has a normal mood and affect. His behavior is normal.  Nursing note and vitals reviewed.  Filed Vitals:   10/31/14 1837  BP: 132/78  Pulse: 75  Temp: 98.7 F (37.1 C)  TempSrc: Oral  Resp: 16  Height: 5\' 11"  (1.803 m)  Weight: 169 lb 6.4 oz (76.839 kg)  SpO2: 98%          Assessment & Plan:   Martin Johnston is a 39 y.o. male Chest wall pain due to injury at work 10/26/14.  - now resolved, no pain on exam and full strength.    -trial of full duty and can stop mobic if not needed.   -rechek in 1 week. Sooner if worse or pain returns.   No orders of the defined types were placed in this encounter.   Patient Instructions  As your pain has resolved, you can try to return to full duty. You can stop the meloxicam in next few days and return next Tuesday the 24th after 4pm to see Dr. Neva SeatGreene.   If pain returns prior to that time and difficulty with your usual work - return sooner for evaluation. Return to the clinic  or go to the nearest emergency room if any of your symptoms worsen or new symptoms occur.     I personally performed the services described in this documentation, which was scribed in my presence. The recorded information has been reviewed and considered, and addended by me as needed.

## 2014-10-31 NOTE — Patient Instructions (Signed)
As your pain has resolved, you can try to return to full duty. You can stop the meloxicam in next few days and return next Tuesday the 24th after 4pm to see Dr. Neva SeatGreene.   If pain returns prior to that time and difficulty with your usual work - return sooner for evaluation. Return to the clinic or go to the nearest emergency room if any of your symptoms worsen or new symptoms occur.

## 2014-11-06 ENCOUNTER — Ambulatory Visit (INDEPENDENT_AMBULATORY_CARE_PROVIDER_SITE_OTHER): Payer: Worker's Compensation | Admitting: Family Medicine

## 2014-11-06 VITALS — BP 106/70 | HR 76 | Temp 98.1°F | Resp 16 | Ht 69.5 in | Wt 160.5 lb

## 2014-11-06 DIAGNOSIS — S29011D Strain of muscle and tendon of front wall of thorax, subsequent encounter: Secondary | ICD-10-CM | POA: Diagnosis not present

## 2014-11-06 NOTE — Patient Instructions (Signed)
As you are off pain medicines and tolerating full duty - no follow up needed at this point unless your symptoms return.  (Return to the clinic or go to the nearest emergency room if any of your symptoms worsen or new symptoms occur)

## 2014-11-06 NOTE — Progress Notes (Signed)
Subjective:    Patient ID: Martin Johnston, male    DOB: [birth year], 39 y.o.   MRN: [id number] This chart was scribed for Meredith Staggers, MD by Jolene Provost, Medical Scribe. This patient was seen in Room 13 and the patient's care was started a 7:38 PM.  HPI HPI Comments: Martin Johnston is a 39 y.o. male who presents to [facility] reporting for a follow up from a work injury. Pt's injury occurred May 13th. Pt had a left chest wall strain, with a negative x-ray. Pt was treated with mobic 7.5 QD prn. Pt had a followup May 18th with no further pain, but pt reported he was still taking mobic. Pt was told he could return to full duty at work on a trial basis, and was advised to stop mobic if not needed.   Pt states he a "just one second" of pain yesterday, but it resolved after he took a brief break. Pt states he is doing well and is able to return to full duty. Pt states he hasn't taken any pain medication for the last 6 days.   There are no active problems to display for this patient.  History reviewed. No pertinent past medical history. Past Surgical History  Procedure Laterality Date  . Neck surgery      "whole in the throat" fixed  . Throat sugery     Allergies  Allergen Reactions  . Penicillins     UPSET STOMACH  . Vicodin [Hydrocodone-Acetaminophen]    Prior to Admission medications   Not on File   History   Social History  . Marital Status: Married    Spouse Name: N/A  . Number of Children: N/A  . Years of Education: N/A   Occupational History  . Not on file.   Social History Main Topics  . Smoking status: Current Every Day Smoker -- 0.50 packs/day  . Smokeless tobacco: Former Neurosurgeon  . Alcohol Use: No  . Drug Use: No  . Sexual Activity: No   Other Topics Concern  . Not on file   Social History Narrative     Review of Systems  Constitutional: Negative for fever and chills.  Musculoskeletal: Negative for myalgias and back pain.  Skin: Negative for color  change and wound.       Objective:   Physical Exam  Constitutional: He is oriented to person, place, and time. He appears well-developed and well-nourished. No distress.  HENT:  Head: Normocephalic and atraumatic.  Eyes: Pupils are equal, round, and reactive to light.  Neck: Neck supple.  Cardiovascular: Normal rate.   Pulmonary/Chest: Effort normal. No respiratory distress.  Musculoskeletal: Normal range of motion.  Left shoulder full rotator cuff strength. ROM without pain. Full strength in bilateral upper extremities. Pectoralis without defect.   Neurological: He is alert and oriented to person, place, and time. Coordination normal.  Skin: Skin is warm and dry. He is not diaphoretic.  Psychiatric: He has a normal mood and affect. His behavior is normal.  Nursing note and vitals reviewed.    Filed Vitals:   11/06/14 1914  BP: 106/70  Pulse: 76  Temp: 98.1 F (36.7 C)  TempSrc: Oral  Resp: 16  Height: 5' 9.5" (1.765 m)  Weight: 160 lb 8 oz (72.802 kg)  SpO2: 98%        Assessment & Plan:   Martin Johnston is a 39 y.o. male Chest wall muscle strain, subsequent encounter  - resolved, tolerating full duty.  Maximal  Med improvement by hx and exam. rtc if recurrence of sx's. Understanding expressed.   No orders of the defined types were placed in this encounter.   Patient Instructions  As you are off pain medicines and tolerating full duty - no follow up needed at this point unless your symptoms return.  (Return to the clinic or go to the nearest emergency room if any of your symptoms worsen or new symptoms occur)    I personally performed the services described in this documentation, which was scribed in my presence. The recorded information has been reviewed and considered, and addended by me as needed.

## 2014-11-29 ENCOUNTER — Emergency Department (HOSPITAL_COMMUNITY)
Admission: EM | Admit: 2014-11-29 | Discharge: 2014-11-29 | Disposition: A | Discharge status: Home / Self Care | Payer: 59 | Attending: Emergency Medicine | Admitting: Emergency Medicine

## 2014-11-29 ENCOUNTER — Encounter (HOSPITAL_COMMUNITY): Payer: Self-pay | Admitting: Emergency Medicine

## 2014-11-29 DIAGNOSIS — R630 Anorexia: Secondary | ICD-10-CM | POA: Diagnosis not present

## 2014-11-29 DIAGNOSIS — Z720 Tobacco use: Secondary | ICD-10-CM | POA: Diagnosis not present

## 2014-11-29 DIAGNOSIS — Z880 Allergy status to penicillin: Secondary | ICD-10-CM | POA: Insufficient documentation

## 2014-11-29 DIAGNOSIS — F1310 Sedative, hypnotic or anxiolytic abuse, uncomplicated: Secondary | ICD-10-CM | POA: Insufficient documentation

## 2014-11-29 DIAGNOSIS — G4700 Insomnia, unspecified: Secondary | ICD-10-CM | POA: Diagnosis not present

## 2014-11-29 DIAGNOSIS — F4320 Adjustment disorder, unspecified: Secondary | ICD-10-CM | POA: Diagnosis not present

## 2014-11-29 DIAGNOSIS — F329 Major depressive disorder, single episode, unspecified: Secondary | ICD-10-CM | POA: Diagnosis not present

## 2014-11-29 DIAGNOSIS — F4321 Adjustment disorder with depressed mood: Secondary | ICD-10-CM

## 2014-11-29 DIAGNOSIS — F32A Depression, unspecified: Secondary | ICD-10-CM

## 2014-11-29 DIAGNOSIS — R45851 Suicidal ideations: Secondary | ICD-10-CM | POA: Diagnosis present

## 2014-11-29 LAB — RAPID URINE DRUG SCREEN, HOSP PERFORMED
Amphetamines: NOT DETECTED
BENZODIAZEPINES: POSITIVE — AB
Barbiturates: NOT DETECTED
COCAINE: NOT DETECTED
Opiates: NOT DETECTED
Tetrahydrocannabinol: NOT DETECTED

## 2014-11-29 LAB — COMPREHENSIVE METABOLIC PANEL
ALT: 13 U/L — ABNORMAL LOW (ref 17–63)
AST: 19 U/L (ref 15–41)
Albumin: 4.5 g/dL (ref 3.5–5.0)
Alkaline Phosphatase: 80 U/L (ref 38–126)
Anion gap: 5 (ref 5–15)
BUN: 10 mg/dL (ref 6–20)
CALCIUM: 9.3 mg/dL (ref 8.9–10.3)
CO2: 27 mmol/L (ref 22–32)
CREATININE: 1.14 mg/dL (ref 0.61–1.24)
Chloride: 106 mmol/L (ref 101–111)
GLUCOSE: 103 mg/dL — AB (ref 65–99)
Potassium: 3.4 mmol/L — ABNORMAL LOW (ref 3.5–5.1)
Sodium: 138 mmol/L (ref 135–145)
Total Bilirubin: 1.1 mg/dL (ref 0.3–1.2)
Total Protein: 7.4 g/dL (ref 6.5–8.1)

## 2014-11-29 LAB — CBC
HEMATOCRIT: 45.6 % (ref 39.0–52.0)
Hemoglobin: 16.5 g/dL (ref 13.0–17.0)
MCH: 31.8 pg (ref 26.0–34.0)
MCHC: 36.2 g/dL — ABNORMAL HIGH (ref 30.0–36.0)
MCV: 87.9 fL (ref 78.0–100.0)
PLATELETS: 283 10*3/uL (ref 150–400)
RBC: 5.19 MIL/uL (ref 4.22–5.81)
RDW: 12.5 % (ref 11.5–15.5)
WBC: 6.2 10*3/uL (ref 4.0–10.5)

## 2014-11-29 LAB — SALICYLATE LEVEL: Salicylate Lvl: <4 mg/dL (ref 2.8–30.0)

## 2014-11-29 LAB — ACETAMINOPHEN LEVEL

## 2014-11-29 LAB — ETHANOL: Alcohol, Ethyl (B): <5 mg/dL (ref <5)

## 2014-11-29 MED ORDER — ONDANSETRON HCL 4 MG PO TABS: 4.0000 mg | ORAL_TABLET | Freq: Three times a day (TID) | ORAL | Status: DC | PRN

## 2014-11-29 MED ORDER — LORAZEPAM 1 MG PO TABS: 1.0000 mg | ORAL_TABLET | Freq: Three times a day (TID) | ORAL | Status: DC | PRN

## 2014-11-29 MED ORDER — ALUM & MAG HYDROXIDE-SIMETH 200-200-20 MG/5ML PO SUSP: 30.0000 mL | ORAL | Status: DC | PRN

## 2014-11-29 MED ORDER — IBUPROFEN 200 MG PO TABS: 600.0000 mg | ORAL_TABLET | Freq: Three times a day (TID) | ORAL | Status: DC | PRN

## 2014-11-29 MED ORDER — ZOLPIDEM TARTRATE 5 MG PO TABS: 5.0000 mg | ORAL_TABLET | Freq: Every evening | ORAL | Status: DC | PRN

## 2014-11-29 NOTE — ED Notes (Signed)
Pt reports that he dos not want to stay overnight and wants to go home.  Pt is aware that they have

## 2014-11-29 NOTE — ED Notes (Signed)
Bed: [facility]36 Expected date:  Expected time:  Means of arrival:  Comments: TR4 

## 2014-11-29 NOTE — ED Notes (Signed)
Dr Lorain Childes into see

## 2014-11-29 NOTE — ED Notes (Signed)
Ambulatory to room 36 from triage

## 2014-11-29 NOTE — ED Notes (Signed)
Pt sent by PCP for suicidality with plan to hang himself.  BIB mom.  Denies HI or substance abuse.

## 2014-11-29 NOTE — ED Notes (Signed)
Up to the bathroom 

## 2014-11-29 NOTE — ED Notes (Signed)
Mom reports that she wants to take the pt home, and that he wants to go home.  She reports that he lives w/ her and is safe there, and has been all right all week until he talked to his father in law.  Mom reports that she took him to the office earlier to keep him on some medication to help and that prior to that he had never mentioned suicidal thoughts.

## 2014-11-29 NOTE — ED Notes (Signed)
Pt's mom into see 

## 2014-11-29 NOTE — ED Notes (Signed)
Dr Lorain Childes is aware  and will talk to the pt.

## 2014-11-29 NOTE — ED Notes (Signed)
Patient  denied Pain, denied SI/HI and denied Hallucinations. Patient in stable condition and he is to be discharged home with mother.

## 2014-11-29 NOTE — ED Provider Notes (Signed)
CSN: [id number]     Arrival date & time 11/29/14  1326 History   First MD Initiated Contact with Patient 11/29/14 1452     Chief Complaint  Patient presents with  . Suicidal     (Consider location/radiation/quality/duration/timing/severity/associated sxs/prior Treatment) Patient is a 39 y.o. male presenting with mental health disorder. The history is provided by the patient. No language interpreter was used.  Mental Health Problem Presenting symptoms: depression and suicidal thoughts   Presenting symptoms: no agitation, no suicidal threats and no suicide attempt   Patient accompanied by:  Family member Degree of incapacity (severity):  Moderate Onset quality:  Sudden Duration:  1 week Timing:  Constant Progression:  Unchanged Chronicity:  New Context: stressful life event   Treatment compliance:  Some of the time Relieved by:  Anti-anxiety medications Worsened by:  Nothing tried Ineffective treatments:  None tried Associated symptoms: anhedonia, appetite change and insomnia   Associated symptoms: no abdominal pain, no chest pain, no fatigue and no headaches   Associated symptoms comment:  Diarrhea   History reviewed. No pertinent past medical history. Past Surgical History  Procedure Laterality Date  . Neck surgery      "whole in the throat" fixed  . Throat sugery     Family History  Problem Relation Age of Onset  . Cancer Mother   . Heart disease Father   . Diabetes Father   . Hypertension Father    History  Substance Use Topics  . Smoking status: Current Every Day Smoker -- 0.50 packs/day  . Smokeless tobacco: Former Neurosurgeon  . Alcohol Use: No    Review of Systems  Constitutional: Positive for appetite change. Negative for fever, activity change and fatigue.  HENT: Negative for congestion, facial swelling, rhinorrhea and trouble swallowing.   Eyes: Negative for photophobia and pain.  Respiratory: Negative for cough, chest tightness and shortness of breath.    Cardiovascular: Negative for chest pain and leg swelling.  Gastrointestinal: Negative for nausea, vomiting, abdominal pain, diarrhea and constipation.  Endocrine: Negative for polydipsia and polyuria.  Genitourinary: Negative for dysuria, urgency, decreased urine volume and difficulty urinating.  Musculoskeletal: Negative for back pain and gait problem.  Skin: Negative for color change, rash and wound.  Allergic/Immunologic: Negative for immunocompromised state.  Neurological: Negative for dizziness, facial asymmetry, speech difficulty, weakness, numbness and headaches.  Psychiatric/Behavioral: Positive for suicidal ideas. Negative for confusion, decreased concentration and agitation. The patient has insomnia.       Allergies  Penicillins and Vicodin  Home Medications   Prior to Admission medications   Medication Sig Start Date End Date Taking? Authorizing Provider  ALPRAZolam (XANAX) 0.25 MG tablet Take 0.25 mg by mouth every 6 (six) hours as needed for anxiety.   Yes Historical Provider, MD  bismuth subsalicylate (PEPTO BISMOL) 262 MG chewable tablet Chew 262 mg by mouth as needed for indigestion or diarrhea or loose stools.   Yes Historical Provider, MD   BP 121/86 mmHg  Pulse 80  Temp(Src) 98 F (36.7 C) (Oral)  Resp 16  SpO2 100% Physical Exam  Constitutional: He is oriented to person, place, and time. He appears well-developed and well-nourished. No distress.  HENT:  Head: Normocephalic and atraumatic.  Mouth/Throat: No oropharyngeal exudate.  Eyes: Pupils are equal, round, and reactive to light.  Neck: Normal range of motion. Neck supple.  Cardiovascular: Normal rate, regular rhythm and normal heart sounds.  Exam reveals no gallop and no friction rub.   No murmur heard. Pulmonary/Chest: Effort  normal and breath sounds normal. No respiratory distress. He has no wheezes. He has no rales.  Abdominal: Soft. Bowel sounds are normal. He exhibits no distension and no mass.  There is no tenderness. There is no rebound and no guarding.  Musculoskeletal: Normal range of motion. He exhibits no edema or tenderness.  Neurological: He is alert and oriented to person, place, and time.  Skin: Skin is warm and dry.  Psychiatric: He exhibits a depressed mood. He expresses suicidal (passive) ideation. He expresses no suicidal plans.    ED Course  Procedures (including critical care time) Labs Review Labs Reviewed  ACETAMINOPHEN LEVEL - Abnormal; Notable for the following:    Acetaminophen (Tylenol), Serum <10 (*)    All other components within normal limits  CBC - Abnormal; Notable for the following:    MCHC 36.2 (*)    All other components within normal limits  COMPREHENSIVE METABOLIC PANEL - Abnormal; Notable for the following:    Potassium 3.4 (*)    Glucose, Bld 103 (*)    ALT 13 (*)    All other components within normal limits  URINE RAPID DRUG SCREEN, HOSP PERFORMED - Abnormal; Notable for the following:    Benzodiazepines POSITIVE (*)    All other components within normal limits  ETHANOL  SALICYLATE LEVEL    Imaging Review No results found.   EKG Interpretation None      MDM   Final diagnoses:  Depression  Grief reaction    Pt is a 39 y.o. male with Pmhx as above who presents with depression, suicidal thoughts.  Patient reports that his wife left him unexpectedly last week that he has been very upset.  He has had episodes of inconsolable crying.  He saw a PA in his PCPs office.  He stated she began asking him questions about suicidal thoughts, which hesaid he had had some thoughts, and had thought about hanging himself if he were to hurt himself.  He does not believe that he would hurt himself.  There is no history of mental illness and suicidal thoughts for self mutilative behavior.  Denies HI or AV hallucinations, his sleep.  Mood and appetite have been poor.  He tells me he does not think he would hurt himself.  His family tells me that he has  a good support system.  Using agreement to stay for a TTS consult, as I believe he would benefit from outpatient resources.  I do not feel he requires an IVC, nor do I think that he must stay for inpatient treatment, if he can contract for safety.   7:22 PM Patient has verbally and in writing contracted for safety.  He denies SI, HI or current plan to harm himself.  He states that he will be staying with his mother and agrees to return to the ED should his symptoms worsen.  He is not expressed active SI to myself or to psychiatry.  He does not want to stay for observation behavior health Hospital.  I believe patient is safe to be discharged home   Fabio Asa evaluation in the Emergency Department is complete. It has been determined that no acute conditions requiring further emergency intervention are present at this time. The patient/guardian have been advised of the diagnosis and plan. We have discussed signs and symptoms that warrant return to the ED, such as changes or worsening in symptoms, thoughts of self-harm.      Toy Cookey, MD 11/30/14 1058

## 2014-11-29 NOTE — Discharge Instructions (Signed)
°Emergency Department Resource Guide °1) Find a Doctor and Pay Out of Pocket °Although you won't have to find out who is covered by your insurance plan, it is a good idea to ask around and get recommendations. You will then need to call the office and see if the doctor you have chosen will accept you as a new patient and what types of options they offer for patients who are self-pay. Some doctors offer discounts or will set up payment plans for their patients who do not have insurance, but you will need to ask so you aren't surprised when you get to your appointment. ° °2) Contact Your Local Health Department °Not all health departments have doctors that can see patients for sick visits, but many do, so it is worth a call to see if yours does. If you don't know where your local health department is, you can check in your phone book. The CDC also has a tool to help you locate your state's health department, and many state websites also have listings of all of their local health departments. ° °3) Find a Walk-in Clinic °If your illness is not likely to be very severe or complicated, you may want to try a walk in clinic. These are popping up all over the country in pharmacies, drugstores, and shopping centers. They're usually staffed by nurse practitioners or physician assistants that have been trained to treat common illnesses and complaints. They're usually fairly quick and inexpensive. However, if you have serious medical issues or chronic medical problems, these are probably not your best option. ° °No Primary Care Doctor: °- Call [facility] at  832-8000 - they can help you locate a primary care doctor that  accepts your insurance, provides certain services, etc. °- Physician Referral Service- 1-800-533-3463 ° °Chronic Pain Problems: °Organization         Address  Phone   Notes  °[facility]  (336) 297-2271 Patients need to be referred by their primary care doctor.  ° °Medication  Assistance: °Organization         Address  Phone   Notes  °[place] [facility] [address] °[place], [place] [address] (336) 641-8030 --Must be a resident of [place] °-- Must have NO insurance coverage whatsoever (no Medicaid/ Medicare, etc.) °-- The pt. MUST have a primary care doctor that directs their care regularly and follows them in the community °  °[facility]  (866) 331-1348   °[employer]  (888) 892-1162   ° °Agencies that provide inexpensive medical care: °Organization         Address  Phone   Notes  °[facility]  (336) 832-8035   °[facility]    (336) 832-7272   °[facility] [address] °[place], [place] [address] (336) 832-4777   °[facility] [address], °[place] (336) 271-4999   °[facility]    (336) 373-0678   °[facility]    (336) 272-1050   °[facility] ° [address], [place] Phone:  (336) 832-4444, Fax:  (336) 832-4440 Hours of Operation:  9 am - 6 pm, M-F.  Also accepts Medicaid/Medicare and self-pay.  ° [facility] ° [address], [place] Phone: (336) [phone], Fax: (336) [phone]. Hours of Operation:  8:30 am - 5:30 pm, M-F.  Also accepts Medicaid and self-pay.  °[facility] [place] [address], [place] Phone: (336) 878-6027   °[facility] [address], [place], [place] (336)723-1848, Ext. 123 Mondays & Thursdays: 7-9 AM.  First 15 patients are seen on a first come, first serve basis. °  ° °Medicaid-accepting [place] Providers: ° °Organization         Address  Phone   Notes  °[facility] [address], [place] (336) 641-2100 Also accepts self-pay patients.  °[facility] [address], [place] ° (336) 856-9996   °[facility] [address], [place]  (336) 288-8857   °[facility] [address] [place] [address], [place] (336) 299-7000   °Veita Bland [address], [place]  ° (336) 373-1557 Only accepts [place] Access Medicaid patients after they have their name applied to their card.  ° °Self-Pay (no insurance) in [place]: ° °Organization         Address  Phone   Notes  °Sickle Cell Patients, [facility] [address], [place] (336) 832-1970   °[facility] [address], [place] (336) 832-4400   ° [facility] ° [address], [place] (336) 992-4800   °[facility]/Dr. Osei-Bonsu ° [address] [place] [address], [place] or [address], [place] (336) 841-8500 Phone number for both [place] and [place] locations is the same.  °[facility] [address], [place] (336) 299-0000   °[facility] [address] [place] [address], [place] or [address] (336) 852-7530 °(336) 878-2260   °[facility] [address], [place] (336) 350-1642, phone; (336) 294-5005, fax Sees patients 1st and 3rd Saturday of every month.  Must not qualify for public or private insurance (i.e. Medicaid, Medicare, [place] Health Choice, Veterans' Benefits) • Household income should be no more than 200% of the poverty level •The clinic cannot treat you if you are pregnant or think you are pregnant • Sexually transmitted diseases are not treated at the clinic.  ° ° °Dental Care: °Organization         Address  Phone  Notes  °[place] [facility] [address], [place] (336) 641-6152 Accepts children up to age 21 who are enrolled in Medicaid or [place] Health Choice; pregnant women with a Medicaid card; and children who have applied for Medicaid or [place] Health Choice, but were declined, whose parents can pay a reduced fee at time of service.  °[place]  [facility] [place]  [address], [place] (336) 641-7733 Accepts children up to age 21 who are enrolled in Medicaid or [place] Health Choice; pregnant women with a Medicaid card; and children who have applied for Medicaid or [place] Health Choice, but were declined, whose parents can pay a reduced fee at time of service.  °[facility] ° [address],  (336) 641-4533 Patients are seen by appointment only. Walk-ins are not accepted. [facility] will see patients 18 years of age and older. °Monday - Tuesday (8am-5pm) °Most Wednesdays (8:30-5pm) °$30 per visit, cash only  °[facility] ° [address], [place] (336) 641-4533 Patients are seen by appointment only. Walk-ins are not accepted. [facility] will see patients 18 years of age and older. °One   Wednesday Evening (Monthly: Volunteer Based).  $30 per visit, cash only  °[facility]  (919) 537-3737 for adults; Children under age 4, call Graduate Pediatric Dentistry at ([phone]) [phone]. Children aged 4-14, please call (919) [phone] to request a pediatric application. ° Dental services are provided in all areas of dental care including fillings, crowns and bridges, complete and partial dentures, implants, gum treatment, root canals, and extractions. Preventive care is also provided. Treatment is provided to both adults and children. °Patients are selected via a lottery and there is often a waiting list. °  °[facility] [address], °[employer] ° (336) 763-8833 [email or web address] °  °[facility] [address], [place], [place] (336)723-1848, Ext. 123 Second and Fourth Thursday of each month, opens at 6:30 AM; Clinic ends at 9 AM.  Patients are seen on a first-come first-served basis, and a limited number are seen during each clinic.  ° °[facility] ° [address], [place], [place] (336) 723-7904    Eligibility Requirements °You must have lived in [place], or [place] counties for at least the last three months. °  You cannot be eligible for state or federal sponsored healthcare insurance, including Veterans Administration, Medicaid, or Medicare. °  You generally cannot be eligible for healthcare insurance through your employer.  °  How to apply: °Eligibility screenings are held every Tuesday and Wednesday afternoon from 1:00 pm until 4:00 pm. You do not need an appointment for the interview!  °[facility] [address], [place], [place] 336-631-2330   °[facility]  336-342-8273   °[facility]  336-703-3100   °[facility]  336-570-6415   ° °Behavioral Health Resources in the Community: °Intensive Outpatient Programs °Organization         Address  Phone  Notes  °[facility] [address], [place], [place] 336-878-6098   °[facility] [address], [place], [place] 336-832-9800   °[facility] [address], [place], [place] ° 336-882-2125   °[facility] [address],  °[place], [place] 1-800-853-5163 or 336-641-4981   °Substance Abuse Resources °Organization         Address  Phone  Notes  °[facility]  336-882-2125   °[facility]  336-784-9470   °[facility]  336-285-9073   °[facility]  336-845-3988   °Residential & Outpatient Substance Abuse Program  1-800-659-3381   °Psychological Services °Organization         Address  Phone  Notes  °[facility]  336- 832-9600   °[facility]  336- 378-7881   °[facility] [address], [place] 1-800-853-5163 or 336-641-4981   ° °Mobile Crisis Teams °Organization         Address  Phone  Notes  °[facility], Mobile Crisis Care Unit  1-877-626-1772   °Assertive °[facility] ° [address]  [place], [place] 336-834-9664   °Sharon DeEsch [address] °[place] 336-554-5454   ° °Self-Help/Support Groups °Organization         Address  Phone             Notes  °[facility] - variety of support groups  336- 373-1402 Call for more information  °Narcotics Anonymous (NA), [facility] [address], °[place]   2 meetings at this location  ° °  Residential Treatment Programs °Organization         Address  Phone  Notes  °[facility] [address],    °[place]  1-866-801-8205   °[facility] ° [address], [place], [place] 704-293-8524   °[facility] [address], [place] 336-845-3988 Admissions: 8am-3pm M-F  °[facility] [address]    °[place], [place] 336-841-1104   °[facility] [address], [place], [place] 336-379-7146   °[facility] [address]  °[place], [place] 336-285-9073   °[facility] - Intensive Outpatient [address], [place], [place] 336-852-3033   °[facility] [address]  °[place], [place] 1-877-615-2722 or 336-784-9470   °[facility] [address] [place], [place] 336-227-7417 Accepts Medicaid  °[facility] [address]  °[place] 1-800-659-3381 Substance Abuse/Addiction Treatment  ° °[place] Behavioral Health Resources °Organization         Address  Phone  Notes  °[facility]  (888) 581-9988   °Julie Brannon, PhD [address] [place], [place]   (336) 349-5553 or (336) 951-0000   °[facility]   [address] °[place], [place] (336) 349-4454   °[facility] [address], [place], [place] (336) 342-8316 Insurance/Medicaid/sponsorship through [facility]  °[facility] [address]                                    [place], [place] (336) 342-8316 Therapy/tele-psych/case    °[facility] [address]  ° [place], [place] (336) 349-2233    °Dr. Arfeen  (336) 349-4544   °[facility] [place]  [employer] [place] [facility] 1) [address], [place] °2) [address], [place] °3)  [address], [place] (336) 349-3220 °(336) 342-7768 ° °(336) 342-8140   °[place] [facility] (336) 342-1394 or (336) 342-3537 (After Hours)    ° ° ° °Depression °Depression refers to feeling sad, low, down in the dumps, blue, gloomy, or empty. In general, there are two kinds of depression: °1. Normal sadness or normal grief. This kind of depression is one that we all feel from time to time after upsetting life experiences, such as the loss of a job or the ending of a relationship. This kind of depression is considered normal, is short lived, and resolves within a few days to 2 weeks. Depression experienced after the loss of a loved one (bereavement) often lasts longer than 2 weeks but normally gets better with time. °2. Clinical depression. This kind of depression lasts longer than normal sadness or normal grief or interferes with your ability to function at home, at work, and in school. It also interferes with your personal relationships. It affects almost every aspect of your life. Clinical depression is an illness. °Symptoms of depression can also be caused by conditions other than those mentioned above, such as: °· Physical illness. Some physical illnesses, including underactive thyroid gland (hypothyroidism), severe anemia, specific types of cancer, diabetes, uncontrolled seizures, heart and lung problems, strokes, and chronic pain are commonly associated with symptoms of depression. °· Side effects of some prescription medicine. In some people, certain types of medicine can cause symptoms of depression. °· Substance abuse. Abuse of alcohol and illicit drugs can cause   symptoms of depression. °SYMPTOMS °Symptoms of normal sadness and normal grief include  the following: °· Feeling sad or crying for short periods of time. °· Not caring about anything (apathy). °· Difficulty sleeping or sleeping too much. °· No longer able to enjoy the things you used to enjoy. °· Desire to be by oneself all the time (social isolation). °· Lack of energy or motivation. °· Difficulty concentrating or remembering. °· Change in appetite or weight. °· Restlessness or agitation. °Symptoms of clinical depression include the same symptoms of normal sadness or normal grief and also the following symptoms: °· Feeling sad or crying all the time. °· Feelings of guilt or worthlessness. °· Feelings of hopelessness or helplessness. °· Thoughts of suicide or the desire to harm yourself (suicidal ideation). °· Loss of touch with reality (psychotic symptoms). Seeing or hearing things that are not real (hallucinations) or having false beliefs about your life or the people around you (delusions and paranoia). °DIAGNOSIS  °The diagnosis of clinical depression is usually based on how bad the symptoms are and how long they have lasted. Your health care provider will also ask you questions about your medical history and substance use to find out if physical illness, use of prescription medicine, or substance abuse is causing your depression. Your health care provider may also order blood tests. °TREATMENT  °Often, normal sadness and normal grief do not require treatment. However, sometimes antidepressant medicine is given for bereavement to ease the depressive symptoms until they resolve. °The treatment for clinical depression depends on how bad the symptoms are but often includes antidepressant medicine, counseling with a mental health professional, or both. Your health care provider will help to determine what treatment is best for you. °Depression caused by physical illness usually goes away with appropriate medical treatment of the illness. If prescription medicine is causing depression, talk with your  health care provider about stopping the medicine, decreasing the dose, or changing to another medicine. °Depression caused by the abuse of alcohol or illicit drugs goes away when you stop using these substances. Some adults need professional help in order to stop drinking or using drugs. °SEEK IMMEDIATE MEDICAL CARE IF: °· You have thoughts about hurting yourself or others. °· You lose touch with reality (have psychotic symptoms). °· You are taking medicine for depression and have a serious side effect. °FOR MORE INFORMATION °· National Alliance on Mental Illness: www.nami.org  °· National Institute of Mental Health: www.nimh.nih.gov  °Document Released: 05/29/2000 Document Revised: 10/16/2013 Document Reviewed: 08/31/2011 °ExitCare® Patient Information ©2015 [employer]. This information is not intended to replace advice given to you by your health care provider. Make sure you discuss any questions you have with your health care provider. ° °

## 2014-11-29 NOTE — ED Notes (Signed)
Pt, being sent by PCP, c/o SI.  PCP reports Pt recently separated from wife.

## 2014-11-29 NOTE — ED Notes (Signed)
NAD, watching tv.  Pt reports that he does not have access to weapons, lives w/ his mother and feels that he would be safe of he left with her.  Pt in agreement to complete the TST assessment.

## 2014-11-29 NOTE — BH Assessment (Signed)
Assessment Note  Martin Johnston is an 39 y.o. male that presents to [facility] with his mother. Patient was referred to [facility] by a NP at [facility]. Patient stating that he went to an appointment today for increased depression. Patient reportedly told the NP that he was suicidal and wanted to hang himself. The suicidal thoughts are triggered by his wife leaving him 4-5 days ago. Patient stating that he wants to work things out with his wife. Patient feels sad that she left him and didn't tell him why. Patient sts that he has tried to contact her multiple times and she hasn't returned any of her calls. During today's assessment patient denied SI. He was able to contract for safety. Patient stated, "I could never hurt myself because I love my mother". Patient has no history of suicidal attempts. He reports a pass history of self mutilating (Age 34). He denies HI and AVH's. He also denies alcohol and drug use.   Axis I: Depressive Disorder NOS Axis II: Deferred Axis III: History reviewed. No pertinent past medical history. Axis IV: other psychosocial or environmental problems, problems related to social environment, problems with access to health care services and problems with primary support group Axis V: 31-40 impairment in reality testing  Past Medical History: History reviewed. No pertinent past medical history.  Past Surgical History  Procedure Laterality Date  . Neck surgery      "whole in the throat" fixed  . Throat sugery      Family History:  Family History  Problem Relation Age of Onset  . Cancer Mother   . Heart disease Father   . Diabetes Father   . Hypertension Father     Social History:  reports that he has been smoking.  He has quit using smokeless tobacco. He reports that he does not drink alcohol or use illicit drugs.  Additional Social History:  Alcohol / Drug Use Pain Medications: SEE MAR Prescriptions: SEE MAR Over the Counter: SEE MAR History of alcohol / drug  use?: No history of alcohol / drug abuse  CIWA: CIWA-Ar BP: 121/86 mmHg Pulse Rate: 80 COWS:    Allergies:  Allergies  Allergen Reactions  . Penicillins     UPSET STOMACH  . Vicodin [Hydrocodone-Acetaminophen]     Unknown reaction     Home Medications:  (Not in a hospital admission)  OB/GYN Status:  No LMP for male patient.  General Assessment Data Location of Assessment: [facility] ED Is this a Tele or Face-to-Face Assessment?: Face-to-Face Is this an Initial Assessment or a Re-assessment for this encounter?: Initial Assessment Marital status: Separated Maiden name:  (n/a) Is patient pregnant?: No Pregnancy Status: No Living Arrangements: Other (Comment) (patient lives with mother) Can pt return to current living arrangement?: No Admission Status: Voluntary Is patient capable of signing voluntary admission?: Yes Referral Source: Self/Family/Friend Insurance type:  Advertising copywriter )     Crisis Care Plan Living Arrangements: Other (Comment) (patient lives with mother) Name of Psychiatrist:  (No psychiatrist ) Name of Therapist:  (No therapist)  Education Status Is patient currently in school?: No Current Grade:  (n/a) Highest grade of school patient has completed:  (12th grade) Name of school:  (n/a) Contact person:  (n/a)  Risk to self with the past 6 months Suicidal Ideation: No Has patient been a risk to self within the past 6 months prior to admission? : No Suicidal Intent: No Has patient had any suicidal intent within the past 6 months prior to  admission? : No Is patient at risk for suicide?: No Suicidal Plan?: No Has patient had any suicidal plan within the past 6 months prior to admission? : No Access to Means: No What has been your use of drugs/alcohol within the last 12 months?:  (n/a) Previous Attempts/Gestures: No How many times?:  (n/a) Other Self Harm Risks:  (patient calm and cooperative ) Triggers for Past Attempts: Other (Comment) (no previous  attempts/gestures ) Intentional Self Injurious Behavior: None Family Suicide History: No Recent stressful life event(s): Other (Comment) ("My wife left me 11/24/2014 ) Persecutory voices/beliefs?: No Depression: Yes Depression Symptoms: Feeling angry/irritable, Feeling worthless/self pity, Loss of interest in usual pleasures, Fatigue, Guilt, Isolating, Despondent Substance abuse history and/or treatment for substance abuse?: No Suicide prevention information given to non-admitted patients: Not applicable  Risk to Others within the past 6 months Homicidal Ideation: No Does patient have any lifetime risk of violence toward others beyond the six months prior to admission? : No Thoughts of Harm to Others: No-Not Currently Present/Within Last 6 Months Current Homicidal Intent: No Current Homicidal Plan: No Access to Homicidal Means: No Identified Victim:  (n/a) History of harm to others?: No Assessment of Violence: None Noted Violent Behavior Description:  (patient is calm and cooperative) Does patient have access to weapons?: No Criminal Charges Pending?: No Does patient have a court date: No Is patient on probation?: No  Psychosis Hallucinations: None noted Delusions: None noted  Mental Status Report Appearance/Hygiene: Disheveled Eye Contact: Good Motor Activity: Freedom of movement Speech: Logical/coherent Level of Consciousness: Alert Mood: Depressed Affect: Appropriate to circumstance Anxiety Level: None Thought Processes: Relevant, Coherent Judgement: Impaired Orientation: Person, Place, Time, Situation Obsessive Compulsive Thoughts/Behaviors: None  Cognitive Functioning Concentration: Decreased Memory: Recent Intact, Remote Intact IQ: Average Insight: Poor Impulse Control: Poor Appetite: Fair Weight Loss:  (none reported) Weight Gain:  (none reported) Sleep: Decreased Total Hours of Sleep:  ("I haven't slept well in several days") Vegetative Symptoms:  None  ADLScreening [facility] Assessment Services) Patient's cognitive ability adequate to safely complete daily activities?: Yes Patient able to express need for assistance with ADLs?: Yes Independently performs ADLs?: Yes (appropriate for developmental age)  Prior Inpatient Therapy Prior Inpatient Therapy: No Prior Therapy Dates:  (n/a) Prior Therapy Facilty/Provider(s):  (n/a) Reason for Treatment:  (n/a)  Prior Outpatient Therapy Prior Outpatient Therapy: No Prior Therapy Dates:  (n/a) Prior Therapy Facilty/Provider(s):  (n/a) Reason for Treatment:  (n/a) Does patient have an ACCT team?: No Does patient have Intensive In-House Services?  : No Does patient have [facility] services? : No Does patient have P4CC services?: No  ADL Screening (condition at time of admission) Patient's cognitive ability adequate to safely complete daily activities?: Yes Is the patient deaf or have difficulty hearing?: No Does the patient have difficulty seeing, even when wearing glasses/contacts?: No Does the patient have difficulty concentrating, remembering, or making decisions?: No Patient able to express need for assistance with ADLs?: Yes Does the patient have difficulty dressing or bathing?: No Independently performs ADLs?: Yes (appropriate for developmental age) Does the patient have difficulty walking or climbing stairs?: No Weakness of Legs: None Weakness of Arms/Hands: None  Home Assistive Devices/Equipment Home Assistive Devices/Equipment: None    Abuse/Neglect Assessment (Assessment to be complete while patient is alone) Physical Abuse: Denies Verbal Abuse: Denies Sexual Abuse: Denies Exploitation of patient/patient's resources: Denies Self-Neglect: Denies Values / Beliefs Cultural Requests During Hospitalization: None Spiritual Requests During Hospitalization: None   Advance Directives (For Healthcare) Does patient have an  advance directive?: No    Additional Information 1:1 In  Past 12 Months?: No CIRT Risk: No Elopement Risk: No Does patient have medical clearance?: Yes     Disposition:  Disposition Initial Assessment Completed for this Encounter: Yes Disposition of Patient: Treatment offered and refused (overnight observation, per Julieanne Cotton, NP) Type of treatment offered and refused: Other (Comment) (pt refused to stay and requested D/C)  On Site Evaluation by:   Reviewed with Physician:    Melynda Ripple [facility] 11/29/2014 7:24 PM

## 2014-11-29 NOTE — ED Notes (Signed)
TTS into see 

## 2014-11-29 NOTE — ED Notes (Addendum)
Pt's  Mother into see

## 2017-11-15 ENCOUNTER — Encounter (HOSPITAL_COMMUNITY): Payer: Self-pay | Admitting: *Deleted

## 2017-11-15 ENCOUNTER — Emergency Department (HOSPITAL_COMMUNITY)
Admission: EM | Admit: 2017-11-15 | Discharge: 2017-11-15 | Disposition: A | Discharge status: Home / Self Care | Payer: BLUE CROSS/BLUE SHIELD | Attending: Emergency Medicine | Admitting: Emergency Medicine

## 2017-11-15 ENCOUNTER — Emergency Department (HOSPITAL_COMMUNITY): Payer: BLUE CROSS/BLUE SHIELD

## 2017-11-15 DIAGNOSIS — R42 Dizziness and giddiness: Secondary | ICD-10-CM | POA: Diagnosis not present

## 2017-11-15 DIAGNOSIS — M79602 Pain in left arm: Secondary | ICD-10-CM | POA: Insufficient documentation

## 2017-11-15 DIAGNOSIS — R079 Chest pain, unspecified: Secondary | ICD-10-CM | POA: Diagnosis not present

## 2017-11-15 DIAGNOSIS — Z5321 Procedure and treatment not carried out due to patient leaving prior to being seen by health care provider: Secondary | ICD-10-CM | POA: Insufficient documentation

## 2017-11-15 LAB — BASIC METABOLIC PANEL
Anion gap: 9 (ref 5–15)
BUN: 12 mg/dL (ref 6–20)
CALCIUM: 9.4 mg/dL (ref 8.9–10.3)
CHLORIDE: 103 mmol/L (ref 101–111)
CO2: 27 mmol/L (ref 22–32)
CREATININE: 1.13 mg/dL (ref 0.61–1.24)
GFR calc Af Amer: >60 mL/min (ref >60)
GFR calc non Af Amer: >60 mL/min (ref >60)
Glucose, Bld: 82 mg/dL (ref 65–99)
Potassium: 4.1 mmol/L (ref 3.5–5.1)
SODIUM: 139 mmol/L (ref 135–145)

## 2017-11-15 LAB — CBC
HCT: 47.3 % (ref 39.0–52.0)
Hemoglobin: 16.4 g/dL (ref 13.0–17.0)
MCH: 30.7 pg (ref 26.0–34.0)
MCHC: 34.7 g/dL (ref 30.0–36.0)
MCV: 88.4 fL (ref 78.0–100.0)
PLATELETS: 283 10*3/uL (ref 150–400)
RBC: 5.35 MIL/uL (ref 4.22–5.81)
RDW: 12.9 % (ref 11.5–15.5)
WBC: 7.5 10*3/uL (ref 4.0–10.5)

## 2017-11-15 LAB — I-STAT TROPONIN, ED: TROPONIN I, POC: 0 ng/mL (ref 0.00–0.08)

## 2017-11-15 NOTE — ED Notes (Signed)
Patient requesting to leave, states he doesn't want to wait here any longer. RN updated patient on status and encouraged him to stay, but he insists that he won't wait anymore. Lab results, vital signs, and imaging reviewed - change in acuity not indicated at this time. Patient ambulatory with steady gait, no apparent distress noted.

## 2017-11-15 NOTE — ED Triage Notes (Signed)
Pt in c/o left arm pain and chest pain, pain is just to left side of chest, denies shortness of breath, reports feeling light headed at times as well, all started this morning, denies history of same

## 2017-11-18 NOTE — ED Notes (Signed)
Follow up call made  No answer  11/18/17  1317  s Valeriano Bain rn

## 2019-04-05 ENCOUNTER — Other Ambulatory Visit: Payer: Self-pay

## 2019-04-05 ENCOUNTER — Ambulatory Visit
Admission: EM | Admit: 2019-04-05 | Discharge: 2019-04-05 | Disposition: A | Discharge status: Home / Self Care | Payer: BC Managed Care – PPO | Attending: Emergency Medicine | Admitting: Emergency Medicine

## 2019-04-05 DIAGNOSIS — M7918 Myalgia, other site: Secondary | ICD-10-CM

## 2019-04-05 MED ORDER — CYCLOBENZAPRINE HCL 5 MG PO TABS: 5.0000 mg | ORAL_TABLET | Freq: Two times a day (BID) | ORAL | 0 refills | Status: DC | PRN

## 2019-04-05 NOTE — Discharge Instructions (Addendum)
Take muscle relaxer as needed for severe pain, spasm. °May ice, rest, elevate the area(s) of pain.  You may also use hot compresses/warm wash rags to relieve muscle tightness. °May use OTC Tylenol, ibuprofen as needed for pain. °Return if you develop worsening pain, chest pain, difficulty breathing. °
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
## 2019-04-06 ENCOUNTER — Telehealth: Payer: Self-pay | Admitting: Emergency Medicine

## 2019-04-06 NOTE — Telephone Encounter (Signed)
Attempted call back to patient.  No working Advertising account executive on home or cell number.

## 2020-02-02 ENCOUNTER — Other Ambulatory Visit: Payer: Self-pay

## 2020-02-02 ENCOUNTER — Ambulatory Visit
Admission: EM | Admit: 2020-02-02 | Discharge: 2020-02-02 | Disposition: A | Discharge status: Home / Self Care | Payer: BC Managed Care – PPO | Attending: Physician Assistant | Admitting: Physician Assistant

## 2020-02-02 DIAGNOSIS — Z1152 Encounter for screening for COVID-19: Secondary | ICD-10-CM | POA: Diagnosis not present

## 2020-02-02 NOTE — Discharge Instructions (Signed)

## 2020-02-02 NOTE — ED Triage Notes (Signed)
Pt requesting covid testing. States his wife is covid positive. Denies s/sx's.

## 2020-02-03 LAB — NOVEL CORONAVIRUS, NAA: SARS-CoV-2, NAA: NOT DETECTED

## 2020-02-03 LAB — SARS-COV-2, NAA 2 DAY TAT

## 2020-04-16 ENCOUNTER — Other Ambulatory Visit: Payer: Self-pay

## 2020-04-16 ENCOUNTER — Emergency Department (HOSPITAL_BASED_OUTPATIENT_CLINIC_OR_DEPARTMENT_OTHER)
Admission: EM | Admit: 2020-04-16 | Discharge: 2020-04-16 | Disposition: A | Discharge status: Home / Self Care | Payer: BC Managed Care – PPO | Attending: Emergency Medicine | Admitting: Emergency Medicine

## 2020-04-16 ENCOUNTER — Emergency Department (HOSPITAL_BASED_OUTPATIENT_CLINIC_OR_DEPARTMENT_OTHER): Payer: BC Managed Care – PPO

## 2020-04-16 ENCOUNTER — Encounter (HOSPITAL_BASED_OUTPATIENT_CLINIC_OR_DEPARTMENT_OTHER): Payer: Self-pay | Admitting: *Deleted

## 2020-04-16 DIAGNOSIS — I889 Nonspecific lymphadenitis, unspecified: Secondary | ICD-10-CM

## 2020-04-16 DIAGNOSIS — M542 Cervicalgia: Secondary | ICD-10-CM | POA: Insufficient documentation

## 2020-04-16 DIAGNOSIS — L040 Acute lymphadenitis of face, head and neck: Secondary | ICD-10-CM | POA: Diagnosis not present

## 2020-04-16 DIAGNOSIS — F17210 Nicotine dependence, cigarettes, uncomplicated: Secondary | ICD-10-CM | POA: Insufficient documentation

## 2020-04-16 DIAGNOSIS — Z8679 Personal history of other diseases of the circulatory system: Secondary | ICD-10-CM | POA: Diagnosis not present

## 2020-04-16 MED ORDER — NAPROXEN 375 MG PO TABS: 375.0000 mg | ORAL_TABLET | Freq: Two times a day (BID) | ORAL | 0 refills | Status: DC

## 2020-04-16 NOTE — Discharge Instructions (Signed)
Your chest x-ray was normal.  This is most likely caused by a viral infection.  You may apply ice over the affected area for pain relief.  Please make sure you apply a towel between your skin and the ice.  Contact a health care provider if you have: Swelling that gets worse or spreads to other areas. Problems with breathing. Lymph glands that: Are still swollen after 2 weeks. Have suddenly gotten bigger. Are red, painful, or hard. A fever or chills. Fatigue. A sore throat. Pain in your abdomen. Weight loss. Night sweats. Get help right away if you have: Fluid leaking from an enlarged lymph gland. Severe pain. Chest pain. Shortness of breath

## 2020-04-16 NOTE — ED Provider Notes (Signed)
[facility] EMERGENCY DEPARTMENT Provider Note   CSN: [id number] Arrival date & time: 04/16/20  1823     History Chief Complaint  Patient presents with  . Otalgia    Martin Johnston is a 44 y.o. male.  Who is a current daily smoker with chief complaint of left neck pain.  Patient noticed a painful area starting 1 week ago.  Patient states that his been tender to palpation, worse when he lays down to go to sleep at night.  He states that today the pain became a lot worse at times with sharp, throbbing and radiated toward his eye.  He denies any fevers, chills, cough, unexplained weight loss, soaking night sweats.  Living at a social media can do healthy  HPI     History reviewed. No pertinent past medical history.  There are no problems to display for this patient.   Past Surgical History:  Procedure Laterality Date  . NECK SURGERY     "whole in the throat" fixed  . throat sugery         Family History  Problem Relation Age of Onset  . Cancer Mother   . Heart disease Father   . Diabetes Father   . Hypertension Father     Social History   Tobacco Use  . Smoking status: Current Every Day Smoker    Packs/day: 0.50  . Smokeless tobacco: Former Engineer, water Use Topics  . Alcohol use: No    Alcohol/week: 0.0 standard drinks  . Drug use: No    Home Medications Prior to Admission medications   Medication Sig Start Date End Date Taking? Authorizing Provider  cyclobenzaprine (FLEXERIL) 5 MG tablet Take 1 tablet (5 mg total) by mouth 2 (two) times daily as needed for muscle spasms. 04/05/19   Hall-Potvin, [place], PA-C    Allergies    Penicillins and Vicodin [hydrocodone-acetaminophen]  Review of Systems   Review of Systems Ten systems reviewed and are negative for acute change, except as noted in the HPI.   Physical Exam Updated Vital Signs BP 123/87 (BP Location: Left Arm)   Pulse 74   Temp 97.6 F (36.4 C) (Oral)   Resp 18   Ht 6'  (1.829 m)   Wt 78 kg   SpO2 99%   BMI 23.33 kg/m   Physical Exam Vitals and nursing note reviewed.  Constitutional:      General: He is not in acute distress.    Appearance: He is well-developed. He is not diaphoretic.  HENT:     Head: Normocephalic and atraumatic.     Right Ear: Tympanic membrane, ear canal and external ear normal.     Left Ear: Tympanic membrane, ear canal and external ear normal.  Eyes:     General: No scleral icterus.    Conjunctiva/sclera: Conjunctivae normal.  Cardiovascular:     Rate and Rhythm: Normal rate and regular rhythm.     Heart sounds: Normal heart sounds.  Pulmonary:     Effort: Pulmonary effort is normal. No respiratory distress.     Breath sounds: Normal breath sounds.  Abdominal:     Palpations: Abdomen is soft.     Tenderness: There is no abdominal tenderness.  Musculoskeletal:     Cervical back: Normal range of motion and neck supple.  Lymphadenopathy:     Cervical: Cervical adenopathy present.     Left cervical: Posterior cervical adenopathy present.     Upper Body:     Right upper  body: Supraclavicular adenopathy present. No axillary, pectoral or epitrochlear adenopathy.     Left upper body: Supraclavicular adenopathy present. No axillary or pectoral adenopathy.  Skin:    General: Skin is warm and dry.  Neurological:     Mental Status: He is alert.  Psychiatric:        Behavior: Behavior normal.     ED Results / Procedures / Treatments   Labs (all labs ordered are listed, but only abnormal results are displayed) Labs Reviewed - No data to display  EKG None  Radiology No results found.  Procedures Procedures (including critical care time)  Medications Ordered in ED Medications - No data to display  ED Course  I have reviewed the triage vital signs and the nursing notes.  Pertinent labs & imaging results that were available during my care of the patient were reviewed by me and considered in my medical decision  making (see chart for details).    MDM Rules/Calculators/A&P                          44 year old male here with left posterior cervical region.  He has lymph nodes that are palpable in the supraclavicular region.  These may simply be a deep cervical lymph nodes as the patient is very thin.  I do not feel lymphadenopathy present in the axillary epitrochlear or groin regions.  Patient has a negative chest x-ray of which I ordered and reviewed images.  I do not see any localized infection.  He has no evidence of ear infection.  This is likely viral.  Discussed report supportive care and return precautions.  He has no B symptoms.  He appears appropriate for discharge at this time.  Discharged with NSAIDs. Final Clinical Impression(s) / ED Diagnoses Final diagnoses:  None    Rx / DC Orders ED Discharge Orders    None       Arthor Captain, PA-C 04/16/20 1957    Vanetta Mulders, MD 04/18/20 [phone]

## 2020-04-16 NOTE — ED Triage Notes (Signed)
Pain behind left ear x 3 days, denies injury. No change in hearing.

## 2020-06-19 ENCOUNTER — Inpatient Hospital Stay (HOSPITAL_COMMUNITY)
Admission: EM | Admit: 2020-06-19 | Discharge: 2020-06-21 | DRG: 247 | Disposition: A | Discharge status: Home / Self Care | Payer: BC Managed Care – PPO | Attending: Cardiovascular Disease | Admitting: Cardiovascular Disease

## 2020-06-19 ENCOUNTER — Inpatient Hospital Stay (HOSPITAL_COMMUNITY)
Admission: EM | Disposition: A | Discharge status: Home / Self Care | Payer: Self-pay | Source: Home / Self Care | Attending: Cardiovascular Disease

## 2020-06-19 ENCOUNTER — Other Ambulatory Visit: Payer: Self-pay

## 2020-06-19 DIAGNOSIS — E785 Hyperlipidemia, unspecified: Secondary | ICD-10-CM

## 2020-06-19 DIAGNOSIS — I2510 Atherosclerotic heart disease of native coronary artery without angina pectoris: Secondary | ICD-10-CM | POA: Diagnosis present

## 2020-06-19 DIAGNOSIS — F17200 Nicotine dependence, unspecified, uncomplicated: Secondary | ICD-10-CM | POA: Diagnosis present

## 2020-06-19 DIAGNOSIS — Z833 Family history of diabetes mellitus: Secondary | ICD-10-CM | POA: Diagnosis not present

## 2020-06-19 DIAGNOSIS — I2584 Coronary atherosclerosis due to calcified coronary lesion: Secondary | ICD-10-CM | POA: Diagnosis present

## 2020-06-19 DIAGNOSIS — Z955 Presence of coronary angioplasty implant and graft: Secondary | ICD-10-CM

## 2020-06-19 DIAGNOSIS — Z8249 Family history of ischemic heart disease and other diseases of the circulatory system: Secondary | ICD-10-CM | POA: Diagnosis not present

## 2020-06-19 DIAGNOSIS — R55 Syncope and collapse: Secondary | ICD-10-CM | POA: Diagnosis not present

## 2020-06-19 DIAGNOSIS — Z23 Encounter for immunization: Secondary | ICD-10-CM

## 2020-06-19 DIAGNOSIS — Z720 Tobacco use: Secondary | ICD-10-CM | POA: Diagnosis present

## 2020-06-19 DIAGNOSIS — I2102 ST elevation (STEMI) myocardial infarction involving left anterior descending coronary artery: Secondary | ICD-10-CM | POA: Diagnosis not present

## 2020-06-19 DIAGNOSIS — R110 Nausea: Secondary | ICD-10-CM | POA: Diagnosis not present

## 2020-06-19 DIAGNOSIS — Z20822 Contact with and (suspected) exposure to covid-19: Secondary | ICD-10-CM | POA: Diagnosis present

## 2020-06-19 DIAGNOSIS — R0789 Other chest pain: Secondary | ICD-10-CM | POA: Diagnosis not present

## 2020-06-19 DIAGNOSIS — Z880 Allergy status to penicillin: Secondary | ICD-10-CM | POA: Diagnosis not present

## 2020-06-19 DIAGNOSIS — I213 ST elevation (STEMI) myocardial infarction of unspecified site: Secondary | ICD-10-CM | POA: Diagnosis not present

## 2020-06-19 DIAGNOSIS — I255 Ischemic cardiomyopathy: Secondary | ICD-10-CM | POA: Diagnosis not present

## 2020-06-19 DIAGNOSIS — Z886 Allergy status to analgesic agent status: Secondary | ICD-10-CM

## 2020-06-19 DIAGNOSIS — R079 Chest pain, unspecified: Secondary | ICD-10-CM | POA: Diagnosis not present

## 2020-06-19 DIAGNOSIS — R001 Bradycardia, unspecified: Secondary | ICD-10-CM | POA: Diagnosis not present

## 2020-06-19 DIAGNOSIS — I361 Nonrheumatic tricuspid (valve) insufficiency: Secondary | ICD-10-CM | POA: Diagnosis not present

## 2020-06-19 HISTORY — PX: CORONARY/GRAFT ACUTE MI REVASCULARIZATION: CATH118305

## 2020-06-19 HISTORY — PX: CORONARY ULTRASOUND/IVUS: CATH118244

## 2020-06-19 HISTORY — PX: LEFT HEART CATH AND CORONARY ANGIOGRAPHY: CATH118249

## 2020-06-19 LAB — CBC
HCT: 46.8 % (ref 39.0–52.0)
Hemoglobin: 16.6 g/dL (ref 13.0–17.0)
MCH: 31.2 pg (ref 26.0–34.0)
MCHC: 35.5 g/dL (ref 30.0–36.0)
MCV: 88 fL (ref 80.0–100.0)
Platelets: 251 10*3/uL (ref 150–400)
RBC: 5.32 MIL/uL (ref 4.22–5.81)
RDW: 12.6 % (ref 11.5–15.5)
WBC: 15.1 10*3/uL — ABNORMAL HIGH (ref 4.0–10.5)
nRBC: 0 % (ref 0.0–0.2)

## 2020-06-19 LAB — POCT I-STAT, CHEM 8
BUN: 15 mg/dL (ref 6–20)
Calcium, Ion: 1.08 mmol/L — ABNORMAL LOW (ref 1.15–1.40)
Chloride: 72 mmol/L — ABNORMAL LOW (ref 98–111)
Creatinine, Ser: 0.4 mg/dL — ABNORMAL LOW (ref 0.61–1.24)
Glucose, Bld: 109 mg/dL — ABNORMAL HIGH (ref 70–99)
HCT: 44 % (ref 39.0–52.0)
Hemoglobin: 15 g/dL (ref 13.0–17.0)
Potassium: 2.9 mmol/L — ABNORMAL LOW (ref 3.5–5.1)
Sodium: 106 mmol/L — CL (ref 135–145)
TCO2: 18 mmol/L — ABNORMAL LOW (ref 22–32)

## 2020-06-19 LAB — BASIC METABOLIC PANEL
Anion gap: 14 (ref 5–15)
BUN: 14 mg/dL (ref 6–20)
CO2: 19 mmol/L — ABNORMAL LOW (ref 22–32)
Calcium: 9.6 mg/dL (ref 8.9–10.3)
Chloride: 102 mmol/L (ref 98–111)
Creatinine, Ser: 1.12 mg/dL (ref 0.61–1.24)
GFR, Estimated: >60 mL/min (ref >60)
Glucose, Bld: 167 mg/dL — ABNORMAL HIGH (ref 70–99)
Potassium: 3.5 mmol/L (ref 3.5–5.1)
Sodium: 135 mmol/L (ref 135–145)

## 2020-06-19 LAB — HIV ANTIBODY (ROUTINE TESTING W REFLEX): HIV Screen 4th Generation wRfx: NONREACTIVE

## 2020-06-19 LAB — LIPID PANEL
Cholesterol: 199 mg/dL (ref 0–200)
HDL: 31 mg/dL — ABNORMAL LOW (ref >40)
LDL Cholesterol: 125 mg/dL — ABNORMAL HIGH (ref 0–99)
Total CHOL/HDL Ratio: 6.4 RATIO
Triglycerides: 217 mg/dL — ABNORMAL HIGH (ref <150)
VLDL: 43 mg/dL — ABNORMAL HIGH (ref 0–40)

## 2020-06-19 LAB — TROPONIN I (HIGH SENSITIVITY)
Troponin I (High Sensitivity): 41 ng/L — ABNORMAL HIGH (ref <18)
Troponin I (High Sensitivity): 2104 ng/L (ref <18)

## 2020-06-19 LAB — MRSA PCR SCREENING: MRSA by PCR: NEGATIVE

## 2020-06-19 LAB — HEMOGLOBIN A1C
Hgb A1c MFr Bld: 5.3 % (ref 4.8–5.6)
Mean Plasma Glucose: 105.41 mg/dL

## 2020-06-19 LAB — APTT: aPTT: 27 seconds (ref 24–36)

## 2020-06-19 LAB — RESP PANEL BY RT-PCR (FLU A&B, COVID) ARPGX2
Influenza A by PCR: NEGATIVE
Influenza B by PCR: NEGATIVE
SARS Coronavirus 2 by RT PCR: NEGATIVE

## 2020-06-19 LAB — POCT ACTIVATED CLOTTING TIME
Activated Clotting Time: 523 s
Activated Clotting Time: 344 seconds

## 2020-06-19 LAB — PROTIME-INR
INR: 1 (ref 0.8–1.2)
Prothrombin Time: 13 seconds (ref 11.4–15.2)

## 2020-06-19 SURGERY — LEFT HEART CATH AND CORONARY ANGIOGRAPHY
Anesthesia: LOCAL

## 2020-06-19 MED ORDER — TICAGRELOR 90 MG PO TABS
ORAL_TABLET | ORAL | Status: DC | PRN
  Administered 2020-06-19: 180 mg via ORAL

## 2020-06-19 MED ORDER — ASPIRIN 81 MG PO CHEW
CHEWABLE_TABLET | ORAL | Status: AC
  Filled 2020-06-19: qty 4

## 2020-06-19 MED ORDER — SODIUM CHLORIDE 0.9 % IV SOLN
INTRAVENOUS | Status: AC | PRN
  Administered 2020-06-19: 10 mL/h via INTRAVENOUS

## 2020-06-19 MED ORDER — SODIUM CHLORIDE 0.9 % WEIGHT BASED INFUSION
1.0000 mL/kg/h | INTRAVENOUS | Status: AC
  Administered 2020-06-19: 1 mL/kg/h via INTRAVENOUS

## 2020-06-19 MED ORDER — ASPIRIN 81 MG PO CHEW: 324.0000 mg | CHEWABLE_TABLET | Freq: Once | ORAL | Status: DC

## 2020-06-19 MED ORDER — TIROFIBAN HCL IN NACL 5-0.9 MG/100ML-% IV SOLN
0.1500 ug/kg/min | INTRAVENOUS | Status: AC
  Administered 2020-06-19: 0.15 ug/kg/min via INTRAVENOUS

## 2020-06-19 MED ORDER — TICAGRELOR 90 MG PO TABS
90.0000 mg | ORAL_TABLET | Freq: Two times a day (BID) | ORAL | Status: DC
  Administered 2020-06-19 – 2020-06-21 (×4): 90 mg via ORAL
  Filled 2020-06-19 (×4): qty 1

## 2020-06-19 MED ORDER — LIDOCAINE HCL (PF) 1 % IJ SOLN
INTRAMUSCULAR | Status: DC | PRN
  Administered 2020-06-19: 2 mL via INTRADERMAL

## 2020-06-19 MED ORDER — TICAGRELOR 90 MG PO TABS
ORAL_TABLET | ORAL | Status: AC
  Filled 2020-06-19: qty 2

## 2020-06-19 MED ORDER — TIROFIBAN HCL IN NACL 5-0.9 MG/100ML-% IV SOLN
INTRAVENOUS | Status: AC
  Filled 2020-06-19: qty 100

## 2020-06-19 MED ORDER — DIAZEPAM 2 MG PO TABS: 5.0000 mg | ORAL_TABLET | ORAL | Status: DC | PRN

## 2020-06-19 MED ORDER — TIROFIBAN HCL IN NACL 5-0.9 MG/100ML-% IV SOLN
INTRAVENOUS | Status: AC | PRN
  Administered 2020-06-19: 0.15 ug/kg/min via INTRAVENOUS

## 2020-06-19 MED ORDER — VERAPAMIL HCL 2.5 MG/ML IV SOLN
INTRAVENOUS | Status: AC
  Filled 2020-06-19: qty 2

## 2020-06-19 MED ORDER — IOHEXOL 350 MG/ML SOLN
INTRAVENOUS | Status: DC | PRN
  Administered 2020-06-19: 100 mL via INTRA_ARTERIAL

## 2020-06-19 MED ORDER — NITROGLYCERIN 1 MG/10 ML FOR IR/CATH LAB
INTRA_ARTERIAL | Status: AC
  Filled 2020-06-19: qty 10

## 2020-06-19 MED ORDER — ONDANSETRON HCL 4 MG/2ML IJ SOLN: 4.0000 mg | Freq: Four times a day (QID) | INTRAMUSCULAR | Status: DC | PRN

## 2020-06-19 MED ORDER — HYDRALAZINE HCL 20 MG/ML IJ SOLN: 10.0000 mg | INTRAMUSCULAR | Status: AC | PRN

## 2020-06-19 MED ORDER — IOHEXOL 350 MG/ML SOLN
INTRAVENOUS | Status: AC
  Filled 2020-06-19: qty 1

## 2020-06-19 MED ORDER — NITROGLYCERIN 0.4 MG SL SUBL: 0.4000 mg | SUBLINGUAL_TABLET | SUBLINGUAL | Status: DC | PRN

## 2020-06-19 MED ORDER — FENTANYL CITRATE (PF) 100 MCG/2ML IJ SOLN
INTRAMUSCULAR | Status: DC | PRN
  Administered 2020-06-19: 25 ug via INTRAVENOUS

## 2020-06-19 MED ORDER — METOPROLOL TARTRATE 12.5 MG HALF TABLET
12.5000 mg | ORAL_TABLET | Freq: Two times a day (BID) | ORAL | Status: DC
  Administered 2020-06-19 – 2020-06-21 (×4): 12.5 mg via ORAL
  Filled 2020-06-19 (×4): qty 1

## 2020-06-19 MED ORDER — HEPARIN (PORCINE) IN NACL 1000-0.9 UT/500ML-% IV SOLN
INTRAVENOUS | Status: DC | PRN
  Administered 2020-06-19 (×3): 500 mL

## 2020-06-19 MED ORDER — LABETALOL HCL 5 MG/ML IV SOLN: 10.0000 mg | INTRAVENOUS | Status: AC | PRN

## 2020-06-19 MED ORDER — HEPARIN (PORCINE) IN NACL 1000-0.9 UT/500ML-% IV SOLN
INTRAVENOUS | Status: AC
  Filled 2020-06-19: qty 500

## 2020-06-19 MED ORDER — HEPARIN SODIUM (PORCINE) 5000 UNIT/ML IJ SOLN: 4000.0000 [IU] | Freq: Once | INTRAMUSCULAR | Status: DC

## 2020-06-19 MED ORDER — FENTANYL CITRATE (PF) 100 MCG/2ML IJ SOLN
INTRAMUSCULAR | Status: AC
  Filled 2020-06-19: qty 2

## 2020-06-19 MED ORDER — SODIUM CHLORIDE 0.9 % IV SOLN: 250.0000 mL | INTRAVENOUS | Status: DC | PRN

## 2020-06-19 MED ORDER — MIDAZOLAM HCL 2 MG/2ML IJ SOLN
INTRAMUSCULAR | Status: AC
  Filled 2020-06-19: qty 2

## 2020-06-19 MED ORDER — ATORVASTATIN CALCIUM 80 MG PO TABS
80.0000 mg | ORAL_TABLET | Freq: Every day | ORAL | Status: DC
  Administered 2020-06-19 – 2020-06-20 (×2): 80 mg via ORAL
  Filled 2020-06-19 (×2): qty 1

## 2020-06-19 MED ORDER — SODIUM CHLORIDE 0.9% FLUSH: 3.0000 mL | INTRAVENOUS | Status: DC | PRN

## 2020-06-19 MED ORDER — HEPARIN SODIUM (PORCINE) 1000 UNIT/ML IJ SOLN
INTRAMUSCULAR | Status: DC | PRN
  Administered 2020-06-19: 8000 [IU] via INTRAVENOUS

## 2020-06-19 MED ORDER — ASPIRIN 81 MG PO CHEW
CHEWABLE_TABLET | ORAL | Status: DC | PRN
  Administered 2020-06-19: 324 mg via ORAL

## 2020-06-19 MED ORDER — SODIUM CHLORIDE 0.9% FLUSH
3.0000 mL | Freq: Two times a day (BID) | INTRAVENOUS | Status: DC
  Administered 2020-06-20 (×2): 3 mL via INTRAVENOUS

## 2020-06-19 MED ORDER — MIDAZOLAM HCL 2 MG/2ML IJ SOLN
INTRAMUSCULAR | Status: DC | PRN
  Administered 2020-06-19: 2 mg via INTRAVENOUS

## 2020-06-19 MED ORDER — TIROFIBAN (AGGRASTAT) BOLUS VIA INFUSION
INTRAVENOUS | Status: DC | PRN
  Administered 2020-06-19: 1950 ug via INTRAVENOUS

## 2020-06-19 MED ORDER — LIDOCAINE HCL (PF) 1 % IJ SOLN
INTRAMUSCULAR | Status: AC
  Filled 2020-06-19: qty 30

## 2020-06-19 MED ORDER — HEPARIN SODIUM (PORCINE) 5000 UNIT/ML IJ SOLN
INTRAMUSCULAR | Status: AC
  Administered 2020-06-19: 4000 [IU] via INTRAVENOUS
  Filled 2020-06-19: qty 1

## 2020-06-19 MED ORDER — SODIUM CHLORIDE 0.9 % IV BOLUS: 1000.0000 mL | Freq: Once | INTRAVENOUS | Status: DC

## 2020-06-19 MED ORDER — ASPIRIN EC 81 MG PO TBEC
81.0000 mg | DELAYED_RELEASE_TABLET | Freq: Every day | ORAL | Status: DC
  Administered 2020-06-20 – 2020-06-21 (×2): 81 mg via ORAL
  Filled 2020-06-19 (×2): qty 1

## 2020-06-19 MED ORDER — EPINEPHRINE 1 MG/10ML IJ SOSY
PREFILLED_SYRINGE | INTRAMUSCULAR | Status: AC
  Filled 2020-06-19: qty 10

## 2020-06-19 SURGICAL SUPPLY — 20 items
BALLN SAPPHIRE ~~LOC~~ 3.75X12 (BALLOONS) ×1 IMPLANT
CATH INFINITI 5 FR JL3.5 (CATHETERS) ×1 IMPLANT
CATH INFINITI JR4 5F (CATHETERS) ×1 IMPLANT
CATH LAUNCHER 5F EBU3.5 (CATHETERS) ×1 IMPLANT
CATH LAUNCHER 6FR EBU3.5 (CATHETERS) ×1 IMPLANT
CATH OPTICROSS HD (CATHETERS) ×1 IMPLANT
DEVICE RAD COMP TR BAND LRG (VASCULAR PRODUCTS) ×1 IMPLANT
ELECT DEFIB PAD ADLT CADENCE (PAD) ×1 IMPLANT
GLIDESHEATH SLEND SS 6F .021 (SHEATH) ×1 IMPLANT
GUIDEWIRE INQWIRE 1.5J.035X260 (WIRE) IMPLANT
INQWIRE 1.5J .035X260CM (WIRE) ×2
KIT ENCORE 26 ADVANTAGE (KITS) ×1 IMPLANT
KIT HEART LEFT (KITS) ×2 IMPLANT
PACK CARDIAC CATHETERIZATION (CUSTOM PROCEDURE TRAY) ×2 IMPLANT
SLED PULL BACK IVUS (MISCELLANEOUS) ×1 IMPLANT
STENT RESOLUTE ONYX 3.5X15 (Permanent Stent) ×1 IMPLANT
TRANSDUCER W/STOPCOCK (MISCELLANEOUS) ×2 IMPLANT
TUBING CIL FLEX 10 FLL-RA (TUBING) ×2 IMPLANT
WIRE COUGAR XT STRL 190CM (WIRE) ×2 IMPLANT
WIRE HI TORQ VERSACORE-J 145CM (WIRE) ×1 IMPLANT

## 2020-06-19 NOTE — Progress Notes (Signed)
ANTICOAGULATION CONSULT NOTE - Initial Consult  Pharmacy Consult for tirofiban Indication: chest pain/ACS  Allergies  Allergen Reactions  . Penicillins     UPSET STOMACH  . Vicodin [Hydrocodone-Acetaminophen]     Unknown reaction     Patient Measurements:    Vital Signs: BP: 112/80 (01/05 1413) Pulse Rate: 0 (01/05 1428)  Labs: Recent Labs    06/19/20 1223 06/19/20 1238  HGB 16.6  --   HCT 46.8  --   PLT 251  --   APTT  --  27  LABPROT  --  13.0  INR  --  1.0  CREATININE 1.12  --   TROPONINIHS 41*  --     CrCl cannot be calculated (Unknown ideal weight.).   Medical History: No past medical history on file.   Assessment: 29 yoM admitted as STEMI. Pharmacy to continue Aggrastat x6h. DES placed, ticagrelor loaded.   Plan:  Tirofiban 0.15 mcg/kg/min x6h total  Fredonia Highland, PharmD, [place], [facility] Clinical Pharmacist [phone] Please check AMION for all [facility] Pharmacy numbers 06/19/2020

## 2020-06-19 NOTE — ED Notes (Signed)
Pt moved over to stretcher- c/o chest pain, started this am- diaphoretic, pale- placed on translucent pads, Dr. Particia Nearing  At bedside, Cath lab notified, Dr Excell Seltzer talking with Dr. Particia Nearing.

## 2020-06-19 NOTE — ED Triage Notes (Signed)
C/O sudden onset of CP radiates to right arm + NV; NTG 0.4 sl x1; stated taken motrin and tylenol this am as well for body sore and aches. Denies other hx.

## 2020-06-19 NOTE — Progress Notes (Signed)
Notified by lab of critical troponin. Expected result.

## 2020-06-19 NOTE — H&P (Addendum)
Cardiology Admission History and Physical:   Patient ID: Martin Johnston MRN: [id number]; DOB: 08-Sep-1975   Admission date: 06/19/2020  Primary Care Provider: Patient, No Pcp Per [facility] Cardiologist: New (Dr. Burt Knack) [place] Electrophysiologist:  None   Chief Complaint:  Chest Pain  Patient Profile:   Martin Johnston is a 45 y.o. male with a history of current tobacco use but no known medical history (although has not been to a medical provider in years) who presented with chest pain and found to have anterolateral STEMI.  History of Present Illness:   Martin Johnston is a 45 year old male with a history of current tobacco use but no known medical history (although has not been to a medical provider in years) who presented with chest pain and found to have anterolateral STEMI.  Patient was in his usual state of health until today when he developed sudden onset chest pain that radiated down his right arm. No associated symptoms. He called EMS. EKG in the field was non-diagnostic. He was brought to the ED and while in the triage area he had a syncopal episode. EKG after syncopal episode showed normal sinus rhythm with ST elevations in anterolateral leads. Code STEMI was called. Patient is still having chest pain. He was taken to the cath lab for emergent cardiac catheterization.  He is an active smoker but denies any alcohol or drug use. He does have a family history of heart disease with his father having a history of CAD/MI.  No past medical history on file.  Past Surgical History:  Procedure Laterality Date  . NECK SURGERY     "whole in the throat" fixed  . throat sugery       Medications Prior to Admission: Prior to Admission medications   Medication Sig Start Date End Date Taking? Authorizing Provider  cyclobenzaprine (FLEXERIL) 5 MG tablet Take 1 tablet (5 mg total) by mouth 2 (two) times daily as needed for muscle spasms. 04/05/19   Hall-Potvin, [place],  PA-C  naproxen (NAPROSYN) 375 MG tablet Take 1 tablet (375 mg total) by mouth 2 (two) times daily with a meal. 04/16/20   Margarita Mail, PA-C     Allergies:    Allergies  Allergen Reactions  . Penicillins     UPSET STOMACH  . Vicodin [Hydrocodone-Acetaminophen]     Unknown reaction     Social History:   Social History   Socioeconomic History  . Marital status: Married    Spouse name: Not on file  . Number of children: Not on file  . Years of education: Not on file  . Highest education level: Not on file  Occupational History  . Not on file  Tobacco Use  . Smoking status: Current Every Day Smoker    Packs/day: 0.50  . Smokeless tobacco: Former Network engineer and Sexual Activity  . Alcohol use: No    Alcohol/week: 0.0 standard drinks  . Drug use: No  . Sexual activity: Never  Other Topics Concern  . Not on file  Social History Narrative  . Not on file   Social Determinants of Health   Financial Resource Strain: Not on file  Food Insecurity: Not on file  Transportation Needs: Not on file  Physical Activity: Not on file  Stress: Not on file  Social Connections: Not on file  Intimate Partner Violence: Not on file    Family History:   The patient's family history includes Cancer in his mother; Diabetes in his father;  Heart disease in his father; Hypertension in his father.    ROS:  Please see the history of present illness.  Unable to get full ROS due to need for emergent cardiac catheterization.  Physical Exam/Data:   Vitals:   06/19/20 1303  SpO2: 100%   No intake or output data in the 24 hours ending 06/19/20 1308 Last 3 Weights 04/16/2020 11/06/2014 10/31/2014  Weight (lbs) 172 lb 160 lb 8 oz 169 lb 6.4 oz  Weight (kg) 78.019 kg 72.802 kg 76.839 kg     There is no height or weight on file to calculate BMI.  General: 45 y.o. male resting comfortably in no acute distress. HEENT: Normocephalic and atraumatic.  Neck: Supple. No JVD. Heart: RRR. Distinct  S1 and S2. No murmurs, gallops, or rubs.  Lungs: No increased work of breathing. Clear to ausculation bilaterally. No wheezes, rhonchi, or rales.  Abdomen: Soft, non-distended, and non-tender to palpation.  Extremities: No lower extremity edema.    Skin: Warm and dry. Neuro: Alert and oriented x3. No focal deficits. Psych: Normal affect. Responds appropriately.  EKG:  The ECG that was done was personally reviewed and demonstrates normal sinus rhythm, rate 66 bpm, with ST elevations in anterolateral leads.   Relevant CV Studies: N/A.  Laboratory Data:  High Sensitivity Troponin:  No results for input(s): TROPONINIHS in the last 720 hours.    ChemistryNo results for input(s): NA, K, CL, CO2, GLUCOSE, BUN, CREATININE, CALCIUM, GFRNONAA, GFRAA, ANIONGAP in the last 168 hours.  No results for input(s): PROT, ALBUMIN, AST, ALT, ALKPHOS, BILITOT in the last 168 hours. Hematology Recent Labs  Lab 06/19/20 1223  WBC 15.1*  RBC 5.32  HGB 16.6  HCT 46.8  MCV 88.0  MCH 31.2  MCHC 35.5  RDW 12.6  PLT 251   BNPNo results for input(s): BNP, PROBNP in the last 168 hours.  DDimer No results for input(s): DDIMER in the last 168 hours.   Radiology/Studies:  No results found.   Assessment and Plan:   Acute Anterolateral STEMI - Patient presented with chest pain with radiation to right arm and then had syncopal episode while in triage. EKG following syncopal episode showed ST elevations in anterolateral leads (V2-V6 and I and aVL) consistent with acute STEMI. - Patient still having chest pain in the ED. - Will check Echo. - Will check high-intensity troponin. - Will check lipid panel and hemoglobin A1c. - Will start high-intensity statin. Anticipate low dose beta-blocker will be started after cath. - Patient was taken immediately to cath lab for emergent cardiac catheterization. Further recommendations to follow.  Syncope - Patient had syncopal episode while in triage. Suspect this was  due to anterolateral STEMI - Will check Echo.  - Will monitor on telemetry during admission for any concerning arrhythmias.  Tobacco Use - Will need to discuss importance of complete cessation during admission.  TIMI Risk Score for ST  Elevation MI:   The patient's TIMI risk score is 2, which indicates a 2.2% risk of all cause mortality at 30 days.   Severity of Illness: The appropriate patient status for this patient is INPATIENT. Inpatient status is judged to be reasonable and necessary in order to provide the required intensity of service to ensure the patient's safety. The patient's presenting symptoms, physical exam findings, and initial radiographic and laboratory data in the context of their chronic comorbidities is felt to place them at high risk for further clinical deterioration. Furthermore, it is not anticipated that the patient will  be medically stable for discharge from the hospital within 2 midnights of admission. The following factors support the patient status of inpatient.   " The patient's presenting symptoms include chest pain and syncope. " The physical exam findings are unremarkable. " The initial radiographic and laboratory data are worrisome because of ST elevations on EKG consistent with STEMI. " The chronic co-morbidities are as above.   * I certify that at the point of admission it is my clinical judgment that the patient will require inpatient hospital care spanning beyond 2 midnights from the point of admission due to high intensity of service, high risk for further deterioration and high frequency of surveillance required.*    For questions or updates, please contact [facility] Please consult [email or web address] for contact info under     Signed, Corrin Parker, PA-C  06/19/2020 1:08 PM   Patient seen, examined. Available data reviewed. Agree with findings, assessment, and plan as outlined by Marjie Skiff, PA-C.  The patient is independently interviewed  and examined in the emergency department.  45 year old male with anterolateral STEMI, initial EKG nondiagnostic, now with marked anterolateral injury current following syncopal episode in the emergency department triage area.  He describes ongoing 7/10 chest discomfort at the time of my evaluation.  Cardiovascular risk factors include tobacco abuse and family history of CAD with multiple MIs in his father beginning in his 49s.  On my exam, the patient is alert, oriented, in mild distress secondary to pain.  He is shivering.  HEENT is normal, carotid upstrokes are normal without bruits, JVP is normal, lungs are clear, heart is regular rate and rhythm with no murmur or gallop, abdomen is soft and nontender, extremities have no edema, skin is warm and dry with no rash, neurologic demonstrates 5/5 strength in the arms and legs bilaterally.  45 year old male with acute anterolateral STEMI.  Plan emergency cardiac catheterization and primary PCI.  Patient's wife is present and understands our plan.  Emergency implied consent is obtained.  Patient will require aggressive secondary risk reduction measures with a high intensity statin drug, dual antiplatelet therapy, and tobacco cessation.  Tonny Bollman, M.D. 06/19/2020 2:26 PM

## 2020-06-19 NOTE — Progress Notes (Signed)
RT responded to Trauma B per RN request for Code STEMI pt who is unresponsive. Upon arrival to room pt alert, no distress noted, SpO2 97% on RA. Per Dr. Particia Nearing RT not needed at this time. RT will continue to monitor and be available as needed.

## 2020-06-19 NOTE — ED Provider Notes (Signed)
[facility] CARDIAC CATH LAB Provider Note   CSN: [id number] Arrival date & time: 06/19/20  1216     History Chief Complaint  Patient presents with  . Chest Pain    Martin Johnston is a 45 y.o. male.  Pt presents to the ED today with CP.  It started while at work around 1030.  He called EMS.  He was given 1 nitro.  No asa.  Pt's EKG by EMS did not show any ST elevation.  Pt went to triage.  While there, he had a syncopal event.  He was not on the monitor at the time.  The nurse said he became diaphoretic and then passed out while sitting in a chair.  She initially could not find a pulse, and was about to start CPR, but then she felt a pulse.  He was rushed back to a room and he is awake now.  He c/o cp.  He has not been covid vaccinated.        No past medical history on file.  There are no problems to display for this patient.   Past Surgical History:  Procedure Laterality Date  . NECK SURGERY     "whole in the throat" fixed  . throat sugery         Family History  Problem Relation Age of Onset  . Cancer Mother   . Heart disease Father   . Diabetes Father   . Hypertension Father     Social History   Tobacco Use  . Smoking status: Current Every Day Smoker    Packs/day: 0.50  . Smokeless tobacco: Former Engineer, water Use Topics  . Alcohol use: No    Alcohol/week: 0.0 standard drinks  . Drug use: No    Home Medications Prior to Admission medications   Medication Sig Start Date End Date Taking? Authorizing Provider  cyclobenzaprine (FLEXERIL) 5 MG tablet Take 1 tablet (5 mg total) by mouth 2 (two) times daily as needed for muscle spasms. 04/05/19   Hall-Potvin, [place], PA-C  naproxen (NAPROSYN) 375 MG tablet Take 1 tablet (375 mg total) by mouth 2 (two) times daily with a meal. 04/16/20   Arthor Captain, PA-C    Allergies    Penicillins and Vicodin [hydrocodone-acetaminophen]  Review of Systems   Review of Systems   Cardiovascular: Positive for chest pain.  All other systems reviewed and are negative.   Physical Exam Updated Vital Signs SpO2 100%   Physical Exam Vitals and nursing note reviewed.  Constitutional:      Appearance: He is well-developed. He is ill-appearing and diaphoretic.  HENT:     Head: Normocephalic and atraumatic.  Eyes:     Extraocular Movements: Extraocular movements intact.     Pupils: Pupils are equal, round, and reactive to light.  Cardiovascular:     Rate and Rhythm: Normal rate and regular rhythm.     Heart sounds: Normal heart sounds.  Pulmonary:     Effort: Pulmonary effort is normal.     Breath sounds: Normal breath sounds.  Abdominal:     General: Bowel sounds are normal.     Palpations: Abdomen is soft.  Musculoskeletal:        General: Normal range of motion.     Cervical back: Normal range of motion and neck supple.  Skin:    General: Skin is warm.     Capillary Refill: Capillary refill takes less than 2 seconds.  Neurological:  General: No focal deficit present.     Mental Status: He is alert and oriented to person, place, and time.     ED Results / Procedures / Treatments   Labs (all labs ordered are listed, but only abnormal results are displayed) Labs Reviewed  RESP PANEL BY RT-PCR (FLU A&B, COVID) ARPGX2  BASIC METABOLIC PANEL  CBC  HEMOGLOBIN A1C  PROTIME-INR  APTT  LIPID PANEL  TROPONIN I (HIGH SENSITIVITY)    EKG EKG Interpretation  Date/Time:  Wednesday June 19 2020 12:42:39 EST Ventricular Rate:  66 PR Interval:    QRS Duration: 126 QT Interval:  437 QTC Calculation: 458 R Axis:   42 Text Interpretation: Sinus rhythm Consider left atrial enlargement Nonspecific intraventricular conduction delay Anterolateral infarct, acute (LAD) inferior wall stemi Confirmed by Jacalyn Lefevre [phone]) on 06/19/2020 1:05:14 PM   Radiology No results found.  Procedures Procedures (including critical care time)  Medications  Ordered in ED Medications  aspirin chewable tablet 324 mg ( Oral MAR Hold 06/19/20 1255)  sodium chloride 0.9 % bolus 1,000 mL ( Intravenous MAR Hold 06/19/20 1255)  heparin injection 4,000 Units ( Intravenous MAR Hold 06/19/20 1255)  heparin 5000 UNIT/ML injection (has no administration in time range)  EPINEPHrine (ADRENALIN) 1 MG/10ML injection (has no administration in time range)  fentaNYL (SUBLIMAZE) injection (25 mcg Intravenous Given 06/19/20 1303)  midazolam (VERSED) injection (1 mg Intravenous Given 06/19/20 1303)  lidocaine (PF) (XYLOCAINE) 1 % injection (2 mLs Intradermal Given 06/19/20 1306)    ED Course  I have reviewed the triage vital signs and the nursing notes.  Pertinent labs & imaging results that were available during my care of the patient were reviewed by me and considered in my medical decision making (see chart for details).    MDM Rules/Calculators/A&P                          Code Stemi called as he has ST elevation in the inferior leads on EKG.  Pt given asa and a heparin bolus.  He was d/w Dr. Excell Seltzer who took pt to the cath lab for an emergent cath.  Covid swab pending at time of transfer to cath lab. CRITICAL CARE Performed by: Jacalyn Lefevre   Total critical care time: 30 minutes  Critical care time was exclusive of separately billable procedures and treating other patients.  Critical care was necessary to treat or prevent imminent or life-threatening deterioration.  Critical care was time spent personally by me on the following activities: development of treatment plan with patient and/or surrogate as well as nursing, discussions with consultants, evaluation of patient's response to treatment, examination of patient, obtaining history from patient or surrogate, ordering and performing treatments and interventions, ordering and review of laboratory studies, ordering and review of radiographic studies, pulse oximetry and re-evaluation of patient's  condition.  Final Clinical Impression(s) / ED Diagnoses Final diagnoses:  ST elevation myocardial infarction (STEMI), unspecified artery (HCC)  Syncope and collapse    Rx / DC Orders ED Discharge Orders    None       Jacalyn Lefevre, MD 06/19/20 1306

## 2020-06-19 NOTE — Progress Notes (Signed)
Responded to code stemi to support patient and staff.  EDP accessing patient.  Patient going to cath lab. Wife is here.  Will escort to cath lab waiting area.  Provided emotional and spiritual support.  Venida Jarvis, [place], [facility], Pager [phone]

## 2020-06-19 NOTE — ED Notes (Signed)
Transported to Cath lab with Zoll and monitor. Dr. Excell Seltzer transporting with staff

## 2020-06-20 ENCOUNTER — Inpatient Hospital Stay (HOSPITAL_COMMUNITY): Payer: BC Managed Care – PPO

## 2020-06-20 ENCOUNTER — Encounter (HOSPITAL_COMMUNITY): Payer: Self-pay | Admitting: Cardiovascular Disease

## 2020-06-20 DIAGNOSIS — I361 Nonrheumatic tricuspid (valve) insufficiency: Secondary | ICD-10-CM | POA: Diagnosis not present

## 2020-06-20 DIAGNOSIS — I2102 ST elevation (STEMI) myocardial infarction involving left anterior descending coronary artery: Secondary | ICD-10-CM | POA: Diagnosis not present

## 2020-06-20 DIAGNOSIS — Z720 Tobacco use: Secondary | ICD-10-CM

## 2020-06-20 LAB — ECHOCARDIOGRAM COMPLETE
Area-P 1/2: 4.01 cm2
Calc EF: 44.8 %
S' Lateral: 3 cm
Single Plane A2C EF: 45.9 %
Single Plane A4C EF: 41.5 %
Weight: 2941.82 oz

## 2020-06-20 LAB — BASIC METABOLIC PANEL
Anion gap: 10 (ref 5–15)
BUN: 15 mg/dL (ref 6–20)
CO2: 20 mmol/L — ABNORMAL LOW (ref 22–32)
Calcium: 8.8 mg/dL — ABNORMAL LOW (ref 8.9–10.3)
Chloride: 98 mmol/L (ref 98–111)
Creatinine, Ser: 1.04 mg/dL (ref 0.61–1.24)
GFR, Estimated: >60 mL/min (ref >60)
Glucose, Bld: 103 mg/dL — ABNORMAL HIGH (ref 70–99)
Potassium: 3.8 mmol/L (ref 3.5–5.1)
Sodium: 128 mmol/L — ABNORMAL LOW (ref 135–145)

## 2020-06-20 LAB — CBC
HCT: 43.4 % (ref 39.0–52.0)
Hemoglobin: 15.5 g/dL (ref 13.0–17.0)
MCH: 31.1 pg (ref 26.0–34.0)
MCHC: 35.7 g/dL (ref 30.0–36.0)
MCV: 87.1 fL (ref 80.0–100.0)
Platelets: 258 10*3/uL (ref 150–400)
RBC: 4.98 MIL/uL (ref 4.22–5.81)
RDW: 12.8 % (ref 11.5–15.5)
WBC: 10.8 10*3/uL — ABNORMAL HIGH (ref 4.0–10.5)
nRBC: 0 % (ref 0.0–0.2)

## 2020-06-20 LAB — LIPID PANEL
Cholesterol: 193 mg/dL (ref 0–200)
HDL: 32 mg/dL — ABNORMAL LOW (ref >40)
LDL Cholesterol: 119 mg/dL — ABNORMAL HIGH (ref 0–99)
Total CHOL/HDL Ratio: 6 RATIO
Triglycerides: 210 mg/dL — ABNORMAL HIGH (ref <150)
VLDL: 42 mg/dL — ABNORMAL HIGH (ref 0–40)

## 2020-06-20 MED ORDER — PNEUMOCOCCAL VAC POLYVALENT 25 MCG/0.5ML IJ INJ
0.5000 mL | INJECTION | INTRAMUSCULAR | Status: AC
  Administered 2020-06-21: 0.5 mL via INTRAMUSCULAR
  Filled 2020-06-20: qty 0.5

## 2020-06-20 MED ORDER — CHLORHEXIDINE GLUCONATE CLOTH 2 % EX PADS
6.0000 | MEDICATED_PAD | Freq: Every day | CUTANEOUS | Status: DC
  Administered 2020-06-20: 6 via TOPICAL

## 2020-06-20 MED ORDER — INFLUENZA VAC SPLIT QUAD 0.5 ML IM SUSY
0.5000 mL | PREFILLED_SYRINGE | INTRAMUSCULAR | Status: AC
  Administered 2020-06-21: 0.5 mL via INTRAMUSCULAR
  Filled 2020-06-20: qty 0.5

## 2020-06-20 MED ORDER — ACETAMINOPHEN 325 MG PO TABS
650.0000 mg | ORAL_TABLET | Freq: Four times a day (QID) | ORAL | Status: DC | PRN
  Administered 2020-06-20: 650 mg via ORAL
  Filled 2020-06-20: qty 2

## 2020-06-20 MED FILL — Verapamil HCl IV Soln 2.5 MG/ML: INTRAVENOUS | Qty: 2 | Status: AC

## 2020-06-20 MED FILL — Nitroglycerin IV Soln 100 MCG/ML in D5W: INTRA_ARTERIAL | Qty: 10 | Status: AC

## 2020-06-20 NOTE — Progress Notes (Signed)
  Echocardiogram 2D Echocardiogram has been performed.  Stark Bray Swaim 06/20/2020, 1:01 PM

## 2020-06-20 NOTE — Plan of Care (Signed)

## 2020-06-20 NOTE — Progress Notes (Signed)
Progress Note  Patient Name: ROSSER COLLINGTON Date of Encounter: 06/20/2020  [facility] Cardiologist: Dr. Tonny Bollman  Subjective   Postop day 1 anterior STEMI history of PCI drug-eluting stenting of the proximal LAD.  Patient has been previously managed  Inpatient Medications    Scheduled Meds: . aspirin EC  81 mg Oral Daily  . atorvastatin  80 mg Oral Daily  . metoprolol tartrate  12.5 mg Oral BID  . sodium chloride flush  3 mL Intravenous Q12H  . ticagrelor  90 mg Oral BID   Continuous Infusions: . sodium chloride    . sodium chloride     PRN Meds: sodium chloride, diazepam, nitroGLYCERIN, ondansetron (ZOFRAN) IV, sodium chloride flush   Vital Signs    Vitals:   06/20/20 0500 06/20/20 0600 06/20/20 0700 06/20/20 0810  BP: 105/67 113/77 119/85   Pulse: 67 62 70   Resp: 17 14 19    Temp:    97.6 F (36.4 C)  TempSrc:    Oral  SpO2: 96% 96% 95%   Weight:        Intake/Output Summary (Last 24 hours) at 06/20/2020 0820 Last data filed at 06/20/2020 0500 Gross per 24 hour  Intake 799.09 ml  Output 2790 ml  Net -1990.91 ml   Last 3 Weights 06/19/2020 04/16/2020 11/06/2014  Weight (lbs) 183 lb 13.8 oz 172 lb 160 lb 8 oz  Weight (kg) 83.4 kg 78.019 kg 72.802 kg      Telemetry    Sinus rhythm- Personally Reviewed  ECG    Sinus rhythm at 65 with resolution Yohana Bartha changes of the left parasternal- Personally Reviewed  Physical Exam   GEN: No acute distress.   Neck: No JVD Cardiac: RRR, no murmurs, rubs, or gallops.  Respiratory: Clear to auscultation bilaterally. GI: Soft, nontender, non-distended  MS: No edema; No deformity. Neuro:  Nonfocal  Psych: Normal affect   Labs    High Sensitivity Troponin:   Recent Labs  Lab 06/19/20 1223 06/19/20 1516  TROPONINIHS 41* 2,104*      Chemistry Recent Labs  Lab 06/19/20 1223 06/19/20 1322 06/20/20 0016  NA 135 106* 128*  K 3.5 2.9* 3.8  CL 102 72* 98  CO2 19*  --  20*  GLUCOSE 167* 109* 103*   BUN 14 15 15   CREATININE 1.12 0.40* 1.04  CALCIUM 9.6  --  8.8*  GFRNONAA >60  --  >60  ANIONGAP 14  --  10     Hematology Recent Labs  Lab 06/19/20 1223 06/19/20 1322 06/20/20 0016  WBC 15.1*  --  10.8*  RBC 5.32  --  4.98  HGB 16.6 15.0 15.5  HCT 46.8 44.0 43.4  MCV 88.0  --  87.1  MCH 31.2  --  31.1  MCHC 35.5  --  35.7  RDW 12.6  --  12.8  PLT 251  --  258    BNPNo results for input(s): BNP, PROBNP in the last 168 hours.   DDimer No results for input(s): DDIMER in the last 168 hours.   Radiology    CARDIAC CATHETERIZATION  Result Date: 06/19/2020  Prox LAD to Mid LAD lesion is 70% stenosed.  Mid LAD lesion is 50% stenosed.  A drug-eluting stent was successfully placed using a STENT RESOLUTE ONYX 3.5X15.  Post intervention, there is a 0% residual stenosis.  1.  Severe single-vessel coronary artery disease with thrombotic proximal LAD lesion, treated successfully with direct stenting using a 3.5 x 15 mm  resolute Onyx DES, postdilated with a 3.75 mm noncompliant balloon with intravascular ultrasound guidance 2.  Widely patent left mainstem, left circumflex, and RCA with mild nonobstructive plaquing noted 3.  Moderate nonobstructive mid LAD stenosis after the second diagonal branch, appropriate for medical therapy Recommendations: Aggrastat x6 hours Patient loaded with aspirin 324 mg and ticagrelor 180 mg in the Cath Lab 2D echocardiogram Medical therapy and tobacco cessation Hospital discharge 24 to 48 hours depending on assessment of LV function and post MI clinical course.    Cardiac Studies   Cardiac catheterization/PCI drug-eluting stent proximal LAD (06/26/2020)  Conclusion    Prox LAD to Mid LAD lesion is 70% stenosed.  Mid LAD lesion is 50% stenosed.  A drug-eluting stent was successfully placed using a STENT RESOLUTE ONYX 3.5X15.  Post intervention, there is a 0% residual stenosis.   1.  Severe single-vessel coronary artery disease with thrombotic  proximal LAD lesion, treated successfully with direct stenting using a 3.5 x 15 mm resolute Onyx DES, postdilated with a 3.75 mm noncompliant balloon with intravascular ultrasound guidance 2.  Widely patent left mainstem, left circumflex, and RCA with mild nonobstructive plaquing noted 3.  Moderate nonobstructive mid LAD stenosis after the second diagonal branch, appropriate for medical therapy  Recommendations: Aggrastat x6 hours Patient loaded with aspirin 324 mg and ticagrelor 180 mg in the Cath Lab 2D echocardiogram Medical therapy and tobacco cessation Hospital discharge 24 to 48 hours depending on assessment of LV function and post MI clinical course.      Coronary Diagrams   Diagnostic Dominance: Right    Intervention     Implants    Permanent Stent   Stent Resolute Onyx 3.5x15 - [id number] - Implanted  Inventory item: Malachi Carl ONYX 3.5X15 Model/Cat number: [id number]  Manufacturer: [employer] Lot number: [id number]  Device identifier: [id number] Device identifier type: GS1   GUDID Information  Request status Successful    Brand name: Resolute OnyxT Version/Model: [id number]  Company name: [employer] MRI safety info as of 06/19/20: MR Conditional  Contains dry or latex rubber: No    GMDN P.T. name: Drug-eluting coronary artery stent, non-bioabsorbable-polymer-coated     As of 06/19/2020  Status: Implanted        Patient Profile     45 y.o. male [place] Caucasian male  History respecters include tobacco abuse.  He has been having chest pain off and on for several months but had worsening chest pain yesterday.  He was brought to the emergency room where his EKG showed changes consistent with anterolateral STEMI he was brought urgently to the Cath Lab for angiography and intervention.  Assessment & Plan    1: Anterior STEMI- rupture plan proximal LAD with TIMI II flow status post PCI and drug-eluting stenting of  proximal LAD which was IVUS guided.  There is an excellent angiographic result.  The patient is on dual antibiotic therapy including aspirin and Brilinta as well as optimal guideline directed medical therapy.  2D echo is pending.  2: Hyperlipidemia- LDL greater than 100 on high-dose atorvastatin  Postop day 1 anterior STEMI treated with PCI and drug-eluting stenting.  EKG shows marked improvement with evolutionary changes.  Patient stable for transfer to telemetry, ambulation and anticipate discharge tomorrow.   For questions or updates, please contact [facility] Please consult [email or web address] for contact info under        Signed, Nanetta Batty, MD  06/20/2020, 8:20 AM

## 2020-06-20 NOTE — Progress Notes (Signed)
CARDIAC REHAB PHASE I   PRE:  Rate/Rhythm: 64 SR  BP:  Sitting: 116/83      SaO2: 96 RA  MODE:  Ambulation: 270 ft   POST:  Rate/Rhythm: 95 SR  BP:  Sitting: 123/80    SaO2: 96 RA   Pt ambulated 245ft in hallway independently with fast, steady gait. Pt denies CP, SOB, or dizziness. Pt returned to bed. Began MI education. Pt and wife educated on importance of ASA, Brilinta, statin, and NTG. Pt given MI book and stent card. Echo at bedside. Left materials will f/u to complete education. Pt referred to [facility] [place].  [id number] Reynold Bowen, RN BSN 06/20/2020 10:51 AM

## 2020-06-21 ENCOUNTER — Other Ambulatory Visit (HOSPITAL_COMMUNITY): Payer: Self-pay | Admitting: Cardiology

## 2020-06-21 DIAGNOSIS — I2102 ST elevation (STEMI) myocardial infarction involving left anterior descending coronary artery: Secondary | ICD-10-CM | POA: Diagnosis not present

## 2020-06-21 DIAGNOSIS — E785 Hyperlipidemia, unspecified: Secondary | ICD-10-CM

## 2020-06-21 DIAGNOSIS — I255 Ischemic cardiomyopathy: Secondary | ICD-10-CM

## 2020-06-21 MED ORDER — ATORVASTATIN CALCIUM 80 MG PO TABS: 80.0000 mg | ORAL_TABLET | Freq: Every day | ORAL | 1 refills | Status: DC

## 2020-06-21 MED ORDER — NITROGLYCERIN 0.4 MG SL SUBL: 0.4000 mg | SUBLINGUAL_TABLET | SUBLINGUAL | 2 refills | Status: DC | PRN

## 2020-06-21 MED ORDER — ANGIOPLASTY BOOK
Freq: Once | Status: AC
  Filled 2020-06-21: qty 1

## 2020-06-21 MED ORDER — THE SENSUOUS HEART BOOK
Freq: Once | Status: AC
  Filled 2020-06-21: qty 1

## 2020-06-21 MED ORDER — ASPIRIN 81 MG PO TBEC: 81.0000 mg | DELAYED_RELEASE_TABLET | Freq: Every day | ORAL | 1 refills | Status: DC

## 2020-06-21 MED ORDER — HEART ATTACK BOUNCING BOOK
Freq: Once | Status: AC
  Filled 2020-06-21: qty 1

## 2020-06-21 MED ORDER — METOPROLOL TARTRATE 25 MG PO TABS: 12.5000 mg | ORAL_TABLET | Freq: Two times a day (BID) | ORAL | 0 refills | Status: DC

## 2020-06-21 MED ORDER — TICAGRELOR 90 MG PO TABS: 90.0000 mg | ORAL_TABLET | Freq: Two times a day (BID) | ORAL | 2 refills | Status: DC

## 2020-06-21 MED FILL — BRILINTA 90 MG TABLET: 90 | 30 days supply | Qty: 60 | Fill #1

## 2020-06-21 MED FILL — ATORVASTATIN CALCIUM 80 MG: 80 | 90 days supply | Qty: 90 | Fill #0

## 2020-06-21 MED FILL — ASPIRIN LOW DOSE 81 MG TBEC: 81 | 90 days supply | Qty: 90 | Fill #0

## 2020-06-21 MED FILL — METOPROLOL TARTRATE 25 MG T: 25 | 90 days supply | Qty: 90 | Fill #0

## 2020-06-21 MED FILL — BRILINTA 90 MG TABLET: 90 | 90 days supply | Qty: 180 | Fill #0

## 2020-06-21 MED FILL — NITROGLYCERIN 0.4 MG TAB SL: 0.4 | 8 days supply | Qty: 25 | Fill #0

## 2020-06-21 NOTE — Progress Notes (Signed)
CARDIAC REHAB PHASE I   PRE:  Rate/Rhythm: 66 SR  MODE:  Ambulation: 450 ft   POST:  Rate/Rhythm: 85 SR   Pt ambulated 446ft in hallway with fast, steady gait. Pt denies CP, SOB, or dizziness. Reinforced importance of ASA and Brilinta. Reviewed site care, restrictions, and exercise guidelines. Spoke about importance of daily weights and smoking cessation. Pt referred to [facility] [place]. Requesting note for work, NP aware.  [id number] Reynold Bowen, RN BSN 06/21/2020 9:14 AM

## 2020-06-21 NOTE — Discharge Summary (Addendum)
Discharge Summary    Patient ID: Martin Johnston MRN: [id number]; DOB: Jan 16, 1976  Admit date: 06/19/2020 Discharge date: 06/21/2020  Primary Care Provider: Patient, No Pcp Per  Primary Cardiologist: Tonny Bollman, MD  Primary Electrophysiologist:  None   Discharge Diagnoses    Principal Problem:   STEMI involving left anterior descending coronary artery [facility]) Active Problems:   Tobacco abuse   Hyperlipidemia    Diagnostic Studies/Procedures    Cath: 06/19/20  Prox LAD to Mid LAD lesion is 70% stenosed. Mid LAD lesion is 50% stenosed. A drug-eluting stent was successfully placed using a STENT RESOLUTE ONYX 3.5X15. Post intervention, there is a 0% residual stenosis.   1.  Severe single-vessel coronary artery disease with thrombotic proximal LAD lesion, treated successfully with direct stenting using a 3.5 x 15 mm resolute Onyx DES, postdilated with a 3.75 mm noncompliant balloon with intravascular ultrasound guidance 2.  Widely patent left mainstem, left circumflex, and RCA with mild nonobstructive plaquing noted 3.  Moderate nonobstructive mid LAD stenosis after the second diagonal branch, appropriate for medical therapy   Recommendations: Aggrastat x6 hours Patient loaded with aspirin 324 mg and ticagrelor 180 mg in the Cath Lab 2D echocardiogram Medical therapy and tobacco cessation Hospital discharge 24 to 48 hours depending on assessment of LV function and post MI clinical course.  Diagnostic Dominance: Right    Intervention    Echo: 06/20/20  IMPRESSIONS     1. Left ventricular ejection fraction, by estimation, is 40 to 45%. The  left ventricle has mildly decreased function. The left ventricle  demonstrates regional wall motion abnormalities (see scoring  diagram/findings for description). Left ventricular  diastolic parameters were normal.   2. Right ventricular systolic function is normal. The right ventricular  size is normal.   3. The mitral valve  is normal in structure. No evidence of mitral valve  regurgitation. No evidence of mitral stenosis.   4. The aortic valve is normal in structure. Aortic valve regurgitation is  not visualized. No aortic stenosis is present.   5. The inferior vena cava is normal in size with greater than 50%  respiratory variability, suggesting right atrial pressure of 3 mmHg.   FINDINGS   Left Ventricle: Left ventricular ejection fraction, by estimation, is 40  to 45%. The left ventricle has mildly decreased function. The left  ventricle demonstrates regional wall motion abnormalities. The left  ventricular internal cavity size was normal  in size. There is no left ventricular hypertrophy. Left ventricular  diastolic parameters were normal.      LV Wall Scoring:  The mid and distal anterior wall, mid and distal anterior septum, mid  inferoseptal segment, apical inferior segment, and apex are hypokinetic.   Right Ventricle: The right ventricular size is normal. No increase in  right ventricular wall thickness. Right ventricular systolic function is  normal.   Left Atrium: Left atrial size was normal in size.   Right Atrium: Right atrial size was normal in size.   Pericardium: There is no evidence of pericardial effusion.   Mitral Valve: The mitral valve is normal in structure. No evidence of  mitral valve regurgitation. No evidence of mitral valve stenosis.   Tricuspid Valve: The tricuspid valve is normal in structure. Tricuspid  valve regurgitation is mild . No evidence of tricuspid stenosis.   Aortic Valve: The aortic valve is normal in structure. Aortic valve  regurgitation is not visualized. No aortic stenosis is present.   Pulmonic Valve: The pulmonic valve was normal  in structure. Pulmonic valve  regurgitation is not visualized. No evidence of pulmonic stenosis.   Aorta: The aortic root is normal in size and structure.   Venous: The inferior vena cava is normal in size with greater  than 50%  respiratory variability, suggesting right atrial pressure of 3 mmHg.   IAS/Shunts: No atrial level shunt detected by color flow Doppler.  _____________   History of Present Illness     Martin Johnston is a 45 y.o. male with a history of current tobacco use but no known medical history (although has not been to a medical provider in years) who presented with chest pain and found to have anterolateral STEMI.   Patient was in his usual state of health until the day of admission when he developed sudden onset chest pain that radiated down his right arm. No associated symptoms. He called EMS. EKG in the field was non-diagnostic. He was brought to the ED and while in the triage area he had a syncopal episode. EKG after syncopal episode showed normal sinus rhythm with ST elevations in anterolateral leads. Code STEMI was called. Patient was still having chest pain. He was taken to the cath lab for emergent cardiac catheterization.   He is an active smoker but denies any alcohol or drug use. He does have a family history of heart disease with his father having a history of early CAD/MI.   Hospital Course     1. Anterior STEMI: underwent cardiac cath noted above with plaque rupture in the pLAD with TIMI II flow post PCI/DES. IVUS guidance was used. He was placed on DAPT with ASA/Brilinta for at least one year. Tolerated the addition of low dose metoprolol at 12.5mg  BID but unable to further titrate as blood pressures were soft. Follow up echo showed an EF of 40-45% mid and distal anterior septum, mid  inferoseptal segment, apical inferior segment, and apex are hypokinetic. Unable to add ACE/ARB with soft blood pressures. Consider as an outpatient. Worked well with cardiac rehab without recurrent chest pain   2. ICM: EF mildly reduced at 40-45% as noted above. Tolerated low dose metoprolol 12.5mg  BID, consider ACE/ARB at follow up.  3. HLD: LDL 119, on high dose statin.   4. Tobacco use:  cessation advised.   Did the patient have an acute coronary syndrome (MI, NSTEMI, STEMI, etc) this admission?:  Yes                               AHA/ACC Clinical Performance & Quality Measures: Aspirin prescribed? - Yes ADP Receptor Inhibitor (Plavix/Clopidogrel, Brilinta/Ticagrelor or Effient/Prasugrel) prescribed (includes medically managed patients)? - Yes Beta Blocker prescribed? - Yes High Intensity Statin (Lipitor 40-80mg  or Crestor 20-40mg ) prescribed? - Yes EF assessed during THIS hospitalization? - Yes For EF <40%, was ACEI/ARB prescribed? - Not Applicable (EF >/= 40%) For EF <40%, Aldosterone Antagonist (Spironolactone or Eplerenone) prescribed? - Not Applicable (EF >/= 40%) Cardiac Rehab Phase II ordered (including medically managed patients)? - Yes       _____________  Discharge Vitals Blood pressure 103/83, pulse 63, temperature 98.1 F (36.7 C), temperature source Oral, resp. rate 16, weight 75.4 kg, SpO2 94 %.  Filed Weights   06/19/20 1450 06/21/20 0446  Weight: 83.4 kg 75.4 kg    Labs & Radiologic Studies    CBC Recent Labs    06/19/20 1223 06/19/20 1322 06/20/20 0016  WBC 15.1*  --  10.8*  HGB 16.6 15.0 15.5  HCT 46.8 44.0 43.4  MCV 88.0  --  87.1  PLT 251  --  195   Basic Metabolic Panel Recent Labs    06/19/20 1223 06/19/20 1322 06/20/20 0016  NA 135 106* 128*  K 3.5 2.9* 3.8  CL 102 72* 98  CO2 19*  --  20*  GLUCOSE 167* 109* 103*  BUN 14 15 15   CREATININE 1.12 0.40* 1.04  CALCIUM 9.6  --  8.8*   Liver Function Tests No results for input(s): AST, ALT, ALKPHOS, BILITOT, PROT, ALBUMIN in the last 72 hours. No results for input(s): LIPASE, AMYLASE in the last 72 hours. High Sensitivity Troponin:   Recent Labs  Lab 06/19/20 1223 06/19/20 1516  TROPONINIHS 41* 2,104*    BNP Invalid input(s): POCBNP D-Dimer No results for input(s): DDIMER in the last 72 hours. Hemoglobin A1C Recent Labs    06/19/20 1238  HGBA1C 5.3   Fasting  Lipid Panel Recent Labs    06/20/20 0016  CHOL 193  HDL 32*  LDLCALC 119*  TRIG 210*  CHOLHDL 6.0   Thyroid Function Tests No results for input(s): TSH, T4TOTAL, T3FREE, THYROIDAB in the last 72 hours.  Invalid input(s): FREET3 _____________  CARDIAC CATHETERIZATION  Result Date: 06/19/2020  Prox LAD to Mid LAD lesion is 70% stenosed.  Mid LAD lesion is 50% stenosed.  A drug-eluting stent was successfully placed using a STENT RESOLUTE ONYX 3.5X15.  Post intervention, there is a 0% residual stenosis.  1.  Severe single-vessel coronary artery disease with thrombotic proximal LAD lesion, treated successfully with direct stenting using a 3.5 x 15 mm resolute Onyx DES, postdilated with a 3.75 mm noncompliant balloon with intravascular ultrasound guidance 2.  Widely patent left mainstem, left circumflex, and RCA with mild nonobstructive plaquing noted 3.  Moderate nonobstructive mid LAD stenosis after the second diagonal branch, appropriate for medical therapy Recommendations: Aggrastat x6 hours Patient loaded with aspirin 324 mg and ticagrelor 180 mg in the Cath Lab 2D echocardiogram Medical therapy and tobacco cessation Hospital discharge 24 to 48 hours depending on assessment of LV function and post MI clinical course.   ECHOCARDIOGRAM COMPLETE  Result Date: 06/20/2020    ECHOCARDIOGRAM REPORT   Patient Name:   Martin Johnston [facility] Date of Exam: 06/20/2020 Medical Rec #:  [id number]          Height:       72.0 in Accession #:    [id number]         Weight:       183.9 lb Date of Birth:  04-13-1976          BSA:          2.056 m Patient Age:    30 years           BP:           116/77 mmHg Patient Gender: M                  HR:           62 bpm. Exam Location:  Inpatient Procedure: 2D Echo, Cardiac Doppler and Color Doppler Indications:    STEMI I21.3  History:        Patient has no prior history of Echocardiogram examinations.                 Risk Factors:Current Smoker.  Sonographer:    Vickie Epley  RDCS Referring Phys: [place]  1. Left ventricular ejection fraction, by estimation, is 40 to 45%. The left ventricle has mildly decreased function. The left ventricle demonstrates regional wall motion abnormalities (see scoring diagram/findings for description). Left ventricular diastolic parameters were normal.  2. Right ventricular systolic function is normal. The right ventricular size is normal.  3. The mitral valve is normal in structure. No evidence of mitral valve regurgitation. No evidence of mitral stenosis.  4. The aortic valve is normal in structure. Aortic valve regurgitation is not visualized. No aortic stenosis is present.  5. The inferior vena cava is normal in size with greater than 50% respiratory variability, suggesting right atrial pressure of 3 mmHg. FINDINGS  Left Ventricle: Left ventricular ejection fraction, by estimation, is 40 to 45%. The left ventricle has mildly decreased function. The left ventricle demonstrates regional wall motion abnormalities. The left ventricular internal cavity size was normal in size. There is no left ventricular hypertrophy. Left ventricular diastolic parameters were normal.  LV Wall Scoring: The mid and distal anterior wall, mid and distal anterior septum, mid inferoseptal segment, apical inferior segment, and apex are hypokinetic. Right Ventricle: The right ventricular size is normal. No increase in right ventricular wall thickness. Right ventricular systolic function is normal. Left Atrium: Left atrial size was normal in size. Right Atrium: Right atrial size was normal in size. Pericardium: There is no evidence of pericardial effusion. Mitral Valve: The mitral valve is normal in structure. No evidence of mitral valve regurgitation. No evidence of mitral valve stenosis. Tricuspid Valve: The tricuspid valve is normal in structure. Tricuspid valve regurgitation is mild . No evidence of tricuspid stenosis. Aortic Valve: The aortic valve is  normal in structure. Aortic valve regurgitation is not visualized. No aortic stenosis is present. Pulmonic Valve: The pulmonic valve was normal in structure. Pulmonic valve regurgitation is not visualized. No evidence of pulmonic stenosis. Aorta: The aortic root is normal in size and structure. Venous: The inferior vena cava is normal in size with greater than 50% respiratory variability, suggesting right atrial pressure of 3 mmHg. IAS/Shunts: No atrial level shunt detected by color flow Doppler.  LEFT VENTRICLE PLAX 2D LVIDd:         4.50 cm      Diastology LVIDs:         3.00 cm      LV e' medial:    7.40 cm/s LV PW:         0.70 cm      LV E/e' medial:  10.1 LV IVS:        0.70 cm      LV e' lateral:   12.50 cm/s LVOT diam:     2.30 cm      LV E/e' lateral: 6.0 LV SV:         67 LV SV Index:   33 LVOT Area:     4.15 cm                              3D Volume EF: LV Volumes (MOD)            3D EF:        50 % LV vol d, MOD A2C: 153.0 ml LV EDV:       176 ml LV vol d, MOD A4C: 127.0 ml LV ESV:       89 ml LV vol s, MOD A2C: 82.8 ml  LV SV:  87 ml LV vol s, MOD A4C: 74.3 ml LV SV MOD A2C:     70.2 ml LV SV MOD A4C:     127.0 ml LV SV MOD BP:      64.3 ml RIGHT VENTRICLE RV S prime:     9.25 cm/s TAPSE (M-mode): 1.9 cm LEFT ATRIUM             Index       RIGHT ATRIUM           Index LA diam:        3.50 cm 1.70 cm/m  RA Area:     11.70 cm LA Vol (A2C):   34.9 ml 16.98 ml/m RA Volume:   26.40 ml  12.84 ml/m LA Vol (A4C):   25.9 ml 12.60 ml/m LA Biplane Vol: 30.4 ml 14.79 ml/m  AORTIC VALVE LVOT Vmax:   70.90 cm/s LVOT Vmean:  53.000 cm/s LVOT VTI:    0.162 m  AORTA Ao Root diam: 3.70 cm Ao Asc diam:  3.30 cm MITRAL VALVE MV Area (PHT): 4.01 cm    SHUNTS MV Decel Time: 189 msec    Systemic VTI:  0.16 m MV E velocity: 75.00 cm/s  Systemic Diam: 2.30 cm MV A velocity: 61.50 cm/s MV E/A ratio:  1.22 Tobias Alexander MD Electronically signed by Tobias Alexander MD Signature Date/Time: 06/20/2020/1:59:23 PM     Final    Disposition   Pt is being discharged home today in good condition.  Follow-up Plans & Appointments     Follow-up Information     Dekari, Bures, NP Follow up on 07/04/2020.   Specialty: Cardiology Why: at 3:15pm for your follow up appt Contact information: [address] [place] [address] [phone]                Discharge Instructions     Amb Referral to Cardiac Rehabilitation   Complete by: As directed    Diagnosis:  Coronary Stents STEMI     After initial evaluation and assessments completed: Virtual Based Care may be provided alone or in conjunction with Phase 2 Cardiac Rehab based on patient barriers.: Yes       Discharge Medications   Allergies as of 06/21/2020       Reactions   Penicillins    UPSET STOMACH   Vicodin [hydrocodone-acetaminophen]    Unknown reaction         Medication List     STOP taking these medications    cyclobenzaprine 5 MG tablet Commonly known as: FLEXERIL   ibuprofen 200 MG tablet Commonly known as: ADVIL   naproxen 375 MG tablet Commonly known as: NAPROSYN       TAKE these medications    acetaminophen 500 MG tablet Commonly known as: TYLENOL Take 500-1,500 mg by mouth daily as needed (chest pain).   aspirin 81 MG EC tablet Take 1 tablet (81 mg total) by mouth daily. Swallow whole.   atorvastatin 80 MG tablet Commonly known as: LIPITOR Take 1 tablet (80 mg total) by mouth daily.   metoprolol tartrate 25 MG tablet Commonly known as: LOPRESSOR Take 0.5 tablets (12.5 mg total) by mouth 2 (two) times daily.   nitroGLYCERIN 0.4 MG SL tablet Commonly known as: NITROSTAT Place 1 tablet (0.4 mg total) under the tongue every 5 (five) minutes x 3 doses as needed for chest pain.   ticagrelor 90 MG Tabs tablet Commonly known as: BRILINTA Take 1 tablet (90 mg total) by  mouth 2 (two) times daily.          Outstanding Labs/Studies   FLP/LFTs in 8 weeks   Duration of Discharge  Encounter   Greater than 30 minutes including physician time.  Signed, Laverda PageLindsay Roberts, NP 06/21/2020, 9:31 AM   Agree with note by Laverda PageLindsay Roberts NP-C  Postop day 2 anterior STEMI treated with PCI drug-eluting stenting of the proximal LAD Dr. Excell Seltzerooper using IVUS guidance.  The remainder his anatomy was unremarkable.  He does have mild to moderate LV dysfunction.  An appropriate medications although his blood pressure is somewhat low to allow initiation of it ACE/ARB or Entresto.  We talked about the importance of smoking cessation and medication compliance.  He will see a midlevel provider TOC 7 and Dr. Excell Seltzerooper in 4 to 6 weeks.   Runell GessJonathan J. Margia Wiesen, M.D., FACP, [facility] Yelencsics CommunityFACC, Earl LagosFAHA, [facility] LLCFSCAI [facility] [address] [place], [place]  [id number]  [phone] 06/21/2020 10:37 AM

## 2020-06-21 NOTE — Plan of Care (Signed)

## 2020-06-21 NOTE — Plan of Care (Signed)

## 2020-07-01 NOTE — Progress Notes (Signed)
Cardiology Office Note   Date:  07/04/2020   ID:  Martin Johnston, DOB 06/05/1976, MRN [id number]  PCP:  Patient, No Pcp Per  Cardiologist:  Dr. Excell Seltzerooper, MD   Chief Complaint  Patient presents with  . Hospitalization Follow-up     History of Present Illness: Martin Johnston is a 45 y.o. male who presents for post hospital follow up, seen for Dr. Excell Seltzerooper.   Mr. Martin Johnston has a history of current tobacco use but no known medical history (although has not been to a medical provider in years) who presented with chest pain and found to have anterolateral STEMI.  He was in his usual state of health until the day of admission when he developed sudden onset chest pain that radiated down his right arm. No associated symptoms. He called EMS. EKG in the field was non-diagnostic. He was brought to the ED and while in the triage area he had a syncopal episode. EKG after syncopal episode showed normal sinus rhythm with ST elevations in anterolateral leads. Code STEMI was called. Patient was still having chest pain. He was taken to the cath lab for emergent cardiac catheterization.  Hehas a family history of heart disease with his father having a history of early CAD/MI.  He underwent cardiac cath that showed pLAD stenosis treated with with PCI/DES. He was placed on DAPT with ASA/Brilinta for at least one year. He was started on metoprolol at 12.5mg  BID but unable to further titrate as blood pressures were soft. Follow up echo showed an EF of 40-45% mid and distal anterior septum, mid inferoseptal segment, apical inferior segment, and apex are hypokinetic. BPs remained on the soft side therefore no ACE/ARB added to his regimen.   He presents today with his wife and he states that he has been doing well since discharge.  He has slowly increase his activity including walking his dog without anginal symptoms. Wife reports that he does have some mild dyspnea however this is after climbing the stairs  at a fast rate>>recovers fairly quickly within 30 seconds to 1 minute.  He does have intermittent left flank pain which is not related to exertion and does not radiate to his chest jaw or back.  Lower leg greater than cardiac etiology.  Symptoms not similar to prior MI.  He is tolerating his medications well.  We discussed the importance of ASA and Brilinta compliance.  We also discussed potentially adding a secondary medication for his cardiomyopathy however will need to be careful with his BP.  We discussed symptoms of orthostatic hypotension and plans for him to call the office if symptoms occur.  He is not necessarily interested in cardiac rehabilitation at this time.  He did stop smoking since his heart attack and was congratulated.  He is already asking to return to work.  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  Overall doing well with his progression.  Past Medical History:  Diagnosis Date  . CAD S/P percutaneous coronary angioplasty    06/2020  . HLD (hyperlipidemia)   . Ischemic cardiomyopathy   . Tobacco abuse    Past Surgical History:  Procedure Laterality Date  . CORONARY/GRAFT ACUTE MI REVASCULARIZATION N/A 06/19/2020   Procedure: Coronary/Graft Acute MI Revascularization;  Surgeon: Tonny Bollmanooper, Michael, MD;  [facility];  Service: Cardiovascular;  Laterality: N/A;  . INTRAVASCULAR ULTRASOUND/IVUS N/A 06/19/2020   Procedure: Intravascular Ultrasound/IVUS;  Surgeon: Tonny Bollmanooper, Michael,  MD;  Location: [facility] INVASIVE CV LAB;  Service: Cardiovascular;  Laterality: N/A;  . LEFT HEART CATH AND CORONARY ANGIOGRAPHY N/A 06/19/2020   Procedure: LEFT HEART CATH AND CORONARY ANGIOGRAPHY;  Surgeon: Tonny Bollman, MD;  Location: [facility] INVASIVE CV LAB;  Service: Cardiovascular;  Laterality: N/A;  . NECK SURGERY     "whole in the throat" fixed  . throat sugery       Current Outpatient Medications  Medication Sig Dispense Refill  .  acetaminophen (TYLENOL) 500 MG tablet Take 500-1,500 mg by mouth daily as needed (chest pain).    Marland Kitchen aspirin EC 81 MG EC tablet Take 1 tablet (81 mg total) by mouth daily. Swallow whole. 90 tablet 1  . atorvastatin (LIPITOR) 80 MG tablet Take 1 tablet (80 mg total) by mouth daily. 90 tablet 1  . lisinopril (ZESTRIL) 2.5 MG tablet Take 1 tablet (2.5 mg total) by mouth daily. 90 tablet 3  . metoprolol tartrate (LOPRESSOR) 25 MG tablet Take 0.5 tablets (12.5 mg total) by mouth 2 (two) times daily. 90 tablet 0  . nitroGLYCERIN (NITROSTAT) 0.4 MG SL tablet Place 1 tablet (0.4 mg total) under the tongue every 5 (five) minutes x 3 doses as needed for chest pain. 25 tablet 2  . ticagrelor (BRILINTA) 90 MG TABS tablet Take 1 tablet (90 mg total) by mouth 2 (two) times daily. 180 tablet 2   No current facility-administered medications for this visit.    Allergies:   Penicillins and Vicodin [hydrocodone-acetaminophen]    Social History:  The patient  reports that he has been smoking. He has been smoking about 0.50 packs per day. He has quit using smokeless tobacco. He reports that he does not drink alcohol and does not use drugs.   Family History:  The patient'sfamily history includes Cancer in his mother; Diabetes in his father; Heart disease in his father; Hypertension in his father.    ROS:  Please see the history of present illness.   Otherwise, review of systems are positive for .All other systems are reviewed and negative.    PHYSICAL EXAM: VS:  BP 118/76   Pulse 71   Ht 6' (1.829 m)   Wt 182 lb (82.6 kg)   SpO2 98%   BMI 24.68 kg/m  , BMI Body mass index is 24.68 kg/m.  General: Well developed, well nourished, NAD Lungs:Clear to ausculation bilaterally. No wheezes, rales, or rhonchi. Breathing is unlabored. Cardiovascular: RRR with S1 S2. No murmurs Extremities: No edema. Neuro: Alert and oriented. No focal deficits. No facial asymmetry. MAE spontaneously. Psych: Responds to questions  appropriately with normal affect.    EKG:  EKG is not ordered today.  Recent Labs: 06/20/2020: BUN 15; Creatinine, Ser 1.04; Hemoglobin 15.5; Platelets 258; Potassium 3.8; Sodium 128    Lipid Panel    Component Value Date/Time   CHOL 193 06/20/2020 0016   TRIG 210 (H) 06/20/2020 0016   HDL 32 (L) 06/20/2020 0016   CHOLHDL 6.0 06/20/2020 0016   VLDL 42 (H) 06/20/2020 0016   LDLCALC 119 (H) 06/20/2020 0016     Wt Readings from Last 3 Encounters:  07/04/20 182 lb (82.6 kg)  06/21/20 166 lb 4.8 oz (75.4 kg)  04/16/20 172 lb (78 kg)    Other studies Reviewed: Additional studies/ records that were reviewed today include:  Review of the above records demonstrates:   Cath: 06/19/20   Prox LAD to Mid LAD lesion is 70% stenosed.  Mid LAD lesion is 50% stenosed.  A drug-eluting stent was successfully placed using a STENT RESOLUTE ONYX 3.5X15.  Post intervention, there is a 0% residual stenosis.  1. Severe single-vessel coronary artery disease with thrombotic proximal LAD lesion, treated successfully with direct stenting using a 3.5 x 15 mm resolute Onyx DES, postdilated with a 3.75 mm noncompliant balloon with intravascular ultrasound guidance 2. Widely patent left mainstem, left circumflex, and RCA with mild nonobstructive plaquing noted 3. Moderate nonobstructive mid LAD stenosis after the second diagonal branch, appropriate for medical therapy  Recommendations: Aggrastat x6 hours Patient loaded with aspirin 324 mg and ticagrelor 180 mg in the Cath Lab 2D echocardiogram Medical therapy and tobacco cessation Hospital discharge 24 to 48 hours depending on assessment of LV function and post MI clinical course.  Diagnostic Dominance: Right    Intervention    Echo: 06/20/20  IMPRESSIONS    1. Left ventricular ejection fraction, by estimation, is 40 to 45%. The  left ventricle has mildly decreased function. The left ventricle  demonstrates regional wall motion  abnormalities (see scoring  diagram/findings for description). Left ventricular  diastolic parameters were normal.  2. Right ventricular systolic function is normal. The right ventricular  size is normal.  3. The mitral valve is normal in structure. No evidence of mitral valve  regurgitation. No evidence of mitral stenosis.  4. The aortic valve is normal in structure. Aortic valve regurgitation is  not visualized. No aortic stenosis is present.  5. The inferior vena cava is normal in size with greater than 50%  respiratory variability, suggesting right atrial pressure of 3 mmHg.   FINDINGS  Left Ventricle: Left ventricular ejection fraction, by estimation, is 40  to 45%. The left ventricle has mildly decreased function. The left  ventricle demonstrates regional wall motion abnormalities. The left  ventricular internal cavity size was normal  in size. There is no left ventricular hypertrophy. Left ventricular  diastolic parameters were normal.     LV Wall Scoring:  The mid and distal anterior wall, mid and distal anterior septum, mid  inferoseptal segment, apical inferior segment, and apex are hypokinetic.   Right Ventricle: The right ventricular size is normal. No increase in  right ventricular wall thickness. Right ventricular systolic function is  normal.   Left Atrium: Left atrial size was normal in size.   Right Atrium: Right atrial size was normal in size.   Pericardium: There is no evidence of pericardial effusion.   Mitral Valve: The mitral valve is normal in structure. No evidence of  mitral valve regurgitation. No evidence of mitral valve stenosis.   Tricuspid Valve: The tricuspid valve is normal in structure. Tricuspid  valve regurgitation is mild . No evidence of tricuspid stenosis.   Aortic Valve: The aortic valve is normal in structure. Aortic valve  regurgitation is not visualized. No aortic stenosis is present.   Pulmonic Valve: The pulmonic valve was  normal in structure. Pulmonic valve  regurgitation is not visualized. No evidence of pulmonic stenosis.   Aorta: The aortic root is normal in size and structure.   Venous: The inferior vena cava is normal in size with greater than 50%  respiratory variability, suggesting right atrial pressure of 3 mmHg.   IAS/Shunts: No atrial level shunt detected by color flow Doppler.  _____________  ASSESSMENT AND PLAN:  1. Anterior STEMI:  -Presented with acute onset anterior chest pain underwent an emergent cardiac cath which showed proximal LAD occlusion at 70% treated with PCI/DES stenting.  Plan is for DAPT with ASA  and Brilinta.  He has been compliant and understands the importance of compliance.  Cath site looks stable without signs of hematoma.  He was started on high intensity atorvastatin during his hospital course and is tolerating this well.  Also started on low-dose beta-blocker.  We will attempt to start low-dose losartan for his ischemic cardiomyopathy however cautioned about the symptoms of orthostatic hypotension.   -Plan for close follow-up with lab work in 4 weeks  2. ICM:  -Echocardiogram with LVEF of 40 to 45%  -Appears euvolemic on exam today  -Tolerating low-dose metoprolol titrate 12.5 mg twice daily  -We will attempt to start losartan 12.5 mg today however cautioned about symptoms of orthostatic hypotension given low normal BP   3. HLD:  -Last LDL 119, with LDL goal at <86 -Continue high intensity atorvastatin and follow with lipid panel at next OV  4. Tobacco use:  -Reports tobacco cessation and was congratulated on this  -Continue cessation efforts  Current medicines are reviewed at length with the patient today.  The patient does not have concerns regarding medicines.  The following changes have been made: Lisinopril 2.5 mg daily  Labs/ tests ordered today include: None No orders of the defined types were placed in this encounter.   Disposition:   FU with APP in  4 weeks  Signed, Georgie Chard, NP  07/04/2020 4:23 PM    [facility] [address] [place], [place], [place]  [address] Phone: [phone]; Fax: [phone]

## 2020-07-04 ENCOUNTER — Encounter: Payer: Self-pay | Admitting: Cardiology

## 2020-07-04 ENCOUNTER — Ambulatory Visit: Payer: BC Managed Care – PPO | Admitting: Cardiology

## 2020-07-04 ENCOUNTER — Other Ambulatory Visit: Payer: Self-pay

## 2020-07-04 VITALS — BP 118/76 | HR 71 | Ht 72.0 in | Wt 182.0 lb

## 2020-07-04 DIAGNOSIS — Z720 Tobacco use: Secondary | ICD-10-CM | POA: Diagnosis not present

## 2020-07-04 DIAGNOSIS — I10 Essential (primary) hypertension: Secondary | ICD-10-CM

## 2020-07-04 DIAGNOSIS — E7841 Elevated Lipoprotein(a): Secondary | ICD-10-CM

## 2020-07-04 DIAGNOSIS — I2102 ST elevation (STEMI) myocardial infarction involving left anterior descending coronary artery: Secondary | ICD-10-CM

## 2020-07-04 DIAGNOSIS — I2510 Atherosclerotic heart disease of native coronary artery without angina pectoris: Secondary | ICD-10-CM

## 2020-07-04 MED ORDER — LISINOPRIL 2.5 MG PO TABS: 2.5000 mg | ORAL_TABLET | Freq: Every day | ORAL | 3 refills | Status: DC

## 2020-07-04 NOTE — Patient Instructions (Signed)
Medication Instructions:  Your physician has recommended you make the following change in your medication:  1. START LISINOPRIL 2.5 MG DAILY.   *If you need a refill on your cardiac medications before your next appointment, please call your pharmacy*   Lab Work: NONE If you have labs (blood work) drawn today and your tests are completely normal, you will receive your results only by: Marland Kitchen MyChart Message (if you have MyChart) OR . A paper copy in the mail If you have any lab test that is abnormal or we need to change your treatment, we will call you to review the results.   Testing/Procedures: NONE   Follow-Up: At [facility], you and your health needs are our priority.  As part of our continuing mission to provide you with exceptional heart care, we have created designated Provider Care Teams.  These Care Teams include your primary Cardiologist (physician) and Advanced Practice Providers (APPs -  Physician Assistants and Nurse Practitioners) who all work together to provide you with the care you need, when you need it.  We recommend signing up for the patient portal called "MyChart".  Sign up information is provided on this After Visit Summary.  MyChart is used to connect with patients for Virtual Visits (Telemedicine).  Patients are able to view lab/test results, encounter notes, upcoming appointments, etc.  Non-urgent messages can be sent to your provider as well.   To learn more about what you can do with MyChart, go to [email or web address].    Your next appointment:   4 week(s)  The format for your next appointment:   In Person  Provider:   Dr. Excell Seltzer or you will see one of the following Advanced Practice Providers on your designated Care Team:    Tereso Newcomer, PA-C  Georgie Chard, NP

## 2020-07-10 ENCOUNTER — Telehealth: Payer: Self-pay | Admitting: Cardiovascular Disease

## 2020-07-10 NOTE — Telephone Encounter (Signed)
Martin Johnston is calling to follow up on a MyChart message he sent to [employer]. He is trying to get a note for work that states he can work with light duty and what is allowed/not allowed. He verbalized that he understood that [name] will respond to the MyChart message or call when she is able too  Thank you!

## 2020-07-10 NOTE — Telephone Encounter (Signed)
Will forward this message to Georgie Chard NP and covering CMA to further review, advise, and follow-up with the pt thereafter, with request for work note.

## 2020-07-11 NOTE — Telephone Encounter (Signed)
Martin Johnston!  Can we get him a work note stating he may come back to work on light duty the week of 07/15/20. No lifting over 25lb. 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 I can't seem to figure out how to construct this letter from the OP setting. Can you help me? I have done plenty in the inpatient world but things look different.   Thank you [name]

## 2020-07-15 ENCOUNTER — Telehealth: Payer: Self-pay | Admitting: Cardiology

## 2020-07-15 NOTE — Telephone Encounter (Signed)
Called the patient and informed him that I have sent his work note through [employer]. He verbalized understanding and will call back with any new questions.

## 2020-07-15 NOTE — Progress Notes (Signed)
   [facility] [address] [place], [place] [address]  July 15, 2020  Patient:  Martin Johnston Date of Birth:  09-Jun-1976 Date of Visit:  UPCOMING 07/31/20  To Whom it May Concern:  Martin Johnston was admitted to the hospital on 06/19/20 and discharged on 06/21/20.  He  may return to work on 07/16/20.  No lifting over 25 pounds. 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      Thank you,   Holiday representative, RN on behalf of Martin Chard, NP

## 2020-07-15 NOTE — Telephone Encounter (Signed)
Do we know if Mr. Rostro was given his work note?  Thank you  [name]

## 2020-07-15 NOTE — Telephone Encounter (Signed)
New Message:      Pt says he needs a note to return to work please.

## 2020-07-18 NOTE — Telephone Encounter (Signed)
Spoke with pt who verbalized that he did get his letter for work. Pt thanked me for the call.

## 2020-07-28 NOTE — Progress Notes (Signed)
Cardiology Office Note   Date:  07/31/2020   ID:  Martin Johnston, DOB 28-Jul-1975, MRN [id number]  PCP:  Patient, No Pcp Per  Cardiologist:  Dr. Tonny Bollman, MD   Chief Complaint  Patient presents with  . Follow-up    History of Present Illness: Martin Johnston is a 45 y.o. male who presents for follow-up, seen for Dr. Excell Seltzer.  Mr. Yurkovich has a history of current tobacco use but no known medical history (although has not been to a medical provider in years) who presented with chest pain and found to have anterolateral STEMI.  He was in his usual state of health until the day of admissionwhen he developed sudden onset chest pain that radiated down his right arm. No associated symptoms. He called EMS. EKG in the field was non-diagnostic. He was brought to the ED and while in the triage area he had a syncopal episode. EKG after syncopal episode showed normal sinus rhythm with ST elevations in anterolateral leads. Code STEMI was called. Patient wasstill having chest pain. He was taken to the cath lab for emergent cardiac catheterization.  Hehas a family history of heart disease with his father having a history ofearlyCAD/MI.  He underwent cardiac cath that showed pLAD stenosis treated with with PCI/DES. He was placed on DAPT with ASA/Brilinta for at least one year. He was started on metoprolol at 12.5mg  BID but unable to further titrate as blood pressures were soft. Follow up echo showed an EF of 40-45%mid and distal anterior septum, mid inferoseptal segment, apical inferior segment, and apex are hypokinetic. BPs remained on the soft side therefore no ACE/ARB added to his regimen.   He presents today with his wife and reports that he is doing very well from a CV standpoint.  He has had no recurrent anginal symptoms.  He is tolerating his medications well.  He is anxious to return fully to work.  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  He has great coworker support at therefore he feels confident that this can be achieved.  He denies chest pain, shortness of breath, LE edema, orthopnea, palpitations, PND, dizziness or syncope.  Plan is to repeat lipid panel and echocardiogram prior to next visit.  No medication changes today.  Continues with smoking cessation.  Congratulated.   Past Medical History:  Diagnosis Date  . CAD S/P percutaneous coronary angioplasty    06/2020  . HLD (hyperlipidemia)   . Ischemic cardiomyopathy   . Tobacco abuse     Past Surgical History:  Procedure Laterality Date  . CORONARY/GRAFT ACUTE MI REVASCULARIZATION N/A 06/19/2020   Procedure: Coronary/Graft Acute MI Revascularization;  Surgeon: Tonny Bollman, MD;  Location: [facility] INVASIVE CV LAB;  Service: Cardiovascular;  Laterality: N/A;  . INTRAVASCULAR ULTRASOUND/IVUS N/A 06/19/2020   Procedure: Intravascular Ultrasound/IVUS;  Surgeon: Tonny Bollman, MD;  Location: [facility] INVASIVE CV LAB;  Service: Cardiovascular;  Laterality: N/A;  . LEFT HEART CATH AND CORONARY ANGIOGRAPHY N/A 06/19/2020   Procedure: LEFT HEART CATH AND CORONARY ANGIOGRAPHY;  Surgeon: Tonny Bollman, MD;  Location: [facility] INVASIVE CV LAB;  Service: Cardiovascular;  Laterality: N/A;  . NECK SURGERY     "whole in the throat" fixed  . throat sugery       Current Outpatient Medications  Medication Sig Dispense Refill  . acetaminophen (TYLENOL) 500 MG tablet Take  500-1,500 mg by mouth daily as needed (chest pain).    Marland Kitchen aspirin EC 81 MG EC tablet Take 1 tablet (81 mg total) by mouth daily. Swallow whole. 90 tablet 1  . atorvastatin (LIPITOR) 80 MG tablet Take 1 tablet (80 mg total) by mouth daily. 90 tablet 1  . lisinopril (ZESTRIL) 2.5 MG tablet Take 1 tablet (2.5 mg total) by mouth daily. 90 tablet 3  . metoprolol tartrate (LOPRESSOR) 25 MG tablet Take 0.5 tablets (12.5 mg total) by  mouth 2 (two) times daily. 90 tablet 0  . nitroGLYCERIN (NITROSTAT) 0.4 MG SL tablet Place 1 tablet (0.4 mg total) under the tongue every 5 (five) minutes x 3 doses as needed for chest pain. 25 tablet 2  . ticagrelor (BRILINTA) 90 MG TABS tablet Take 1 tablet (90 mg total) by mouth 2 (two) times daily. 180 tablet 2   No current facility-administered medications for this visit.    Allergies:   Penicillins and Vicodin [hydrocodone-acetaminophen]    Social History:  The patient  reports that he has been smoking. He has been smoking about 0.50 packs per day. He has quit using smokeless tobacco. He reports that he does not drink alcohol and does not use drugs.   Family History:  The patient's family history includes Cancer in his mother; Diabetes in his father; Heart disease in his father; Hypertension in his father.    ROS:  Please see the history of present illness.   Otherwise, review of systems are positive for none. All other systems are reviewed and negative.    PHYSICAL EXAM: VS:  BP 102/70   Pulse 67   Ht 6' (1.829 m)   Wt 181 lb (82.1 kg)   SpO2 99%   BMI 24.55 kg/m  , BMI Body mass index is 24.55 kg/m.   General: Well developed, well nourished, NAD Lungs:Clear to ausculation bilaterally. No wheezes, rales, or rhonchi. Breathing is unlabored. Cardiovascular: RRR with S1 S2. No murmurs, Extremities: No edema.  Radial pulses 2+ bilaterally Neuro: Alert and oriented. No focal deficits. No facial asymmetry. MAE spontaneously. Psych: Responds to questions appropriately with normal affect.     EKG:  EKG is not ordered today.  Recent Labs: 06/20/2020: BUN 15; Creatinine, Ser 1.04; Hemoglobin 15.5; Platelets 258; Potassium 3.8; Sodium 128    Lipid Panel    Component Value Date/Time   CHOL 193 06/20/2020 0016   TRIG 210 (H) 06/20/2020 0016   HDL 32 (L) 06/20/2020 0016   CHOLHDL 6.0 06/20/2020 0016   VLDL 42 (H) 06/20/2020 0016   LDLCALC 119 (H) 06/20/2020 0016    Wt  Readings from Last 3 Encounters:  07/31/20 181 lb (82.1 kg)  07/04/20 182 lb (82.6 kg)  06/21/20 166 lb 4.8 oz (75.4 kg)    Other studies Reviewed: Additional studies/ records that were reviewed today include:  Review of the above records demonstrates:   Cath: 06/19/20   Prox LAD to Mid LAD lesion is 70% stenosed.  Mid LAD lesion is 50% stenosed.  A drug-eluting stent was successfully placed using a STENT RESOLUTE ONYX 3.5X15.  Post intervention, there is a 0% residual stenosis.  1. Severe single-vessel coronary artery disease with thrombotic proximal LAD lesion, treated successfully with direct stenting using a 3.5 x 15 mm resolute Onyx DES, postdilated with a 3.75 mm noncompliant balloon with intravascular ultrasound guidance 2. Widely patent left mainstem, left circumflex, and RCA with mild nonobstructive plaquing noted 3. Moderate nonobstructive mid LAD stenosis after the  second diagonal branch, appropriate for medical therapy  Recommendations: Aggrastat x6 hours Patient loaded with aspirin 324 mg and ticagrelor 180 mg in the Cath Lab 2D echocardiogram Medical therapy and tobacco cessation Hospital discharge 24 to 48 hours depending on assessment of LV function and post MI clinical course. Diagnostic Dominance: Right    Intervention    Echo: 06/20/20  IMPRESSIONS    1. Left ventricular ejection fraction, by estimation, is 40 to 45%. The  left ventricle has mildly decreased function. The left ventricle  demonstrates regional wall motion abnormalities (see scoring  diagram/findings for description). Left ventricular  diastolic parameters were normal.  2. Right ventricular systolic function is normal. The right ventricular  size is normal.  3. The mitral valve is normal in structure. No evidence of mitral valve  regurgitation. No evidence of mitral stenosis.  4. The aortic valve is normal in structure. Aortic valve regurgitation is  not visualized.  No aortic stenosis is present.  5. The inferior vena cava is normal in size with greater than 50%  respiratory variability, suggesting right atrial pressure of 3 mmHg.   FINDINGS  Left Ventricle: Left ventricular ejection fraction, by estimation, is 40  to 45%. The left ventricle has mildly decreased function. The left  ventricle demonstrates regional wall motion abnormalities. The left  ventricular internal cavity size was normal  in size. There is no left ventricular hypertrophy. Left ventricular  diastolic parameters were normal.    LV Wall Scoring:  The mid and distal anterior wall, mid and distal anterior septum, mid  inferoseptal segment, apical inferior segment, and apex are hypokinetic.   Right Ventricle: The right ventricular size is normal. No increase in  right ventricular wall thickness. Right ventricular systolic function is  normal.   Left Atrium: Left atrial size was normal in size.   Right Atrium: Right atrial size was normal in size.   Pericardium: There is no evidence of pericardial effusion.   Mitral Valve: The mitral valve is normal in structure. No evidence of  mitral valve regurgitation. No evidence of mitral valve stenosis.   Tricuspid Valve: The tricuspid valve is normal in structure. Tricuspid  valve regurgitation is mild . No evidence of tricuspid stenosis.   Aortic Valve: The aortic valve is normal in structure. Aortic valve  regurgitation is not visualized. No aortic stenosis is present.   Pulmonic Valve: The pulmonic valve was normal in structure. Pulmonic valve  regurgitation is not visualized. No evidence of pulmonic stenosis.   Aorta: The aortic root is normal in size and structure.   Venous: The inferior vena cava is normal in size with greater than 50%  respiratory variability, suggesting right atrial pressure of 3 mmHg.   IAS/Shunts: No atrial level shunt detected by color flow Doppler.  _____________  ASSESSMENT AND PLAN:  1.  CAD s/p anterior STEMI:  -LHC 05/2020 with pLAD occlusion at 70% treated with PCI/DES stenting.  -Tolerating DAPT with ASA and Brilinta  -No bleeding in stool or urine  -Denies recurrent anginal symptoms  -Continue ASA, Brilinta, low-dose beta-blocker and statin therapy.    2. ICM:  -Echocardiogram with LVEF of 40 to 45%  -Appears euvolemic on exam today  -Tolerating low-dose metoprolol titrate 12.5 mg twice daily  -Started on lisinopril at last OV -GDMT limited by soft BPs -Plan repeat echo prior to next OV  3. HLD: -Last LDL 119, with LDL goal at <15 -Continue high intensity atorvastatin and follow with lipid panel at next OV  4. Tobacco use: -Reports tobacco cessation and was congratulated on this  -Continue cessation efforts  Current medicines are reviewed at length with the patient today.  The patient does not have concerns regarding medicines.  The following changes have been made:  no change  Labs/ tests ordered today include: Lipid panel and echocardiogram   Orders Placed This Encounter  Procedures  . Lipid panel  . ECHOCARDIOGRAM COMPLETE    Disposition:   FU with myself of Dr. Excell Seltzerooper in 3 months  Signed, Georgie ChardJill Chilcott, NP  07/31/2020 9:47 AM    [facility] [address], [place], [place]  [address] Phone: [phone]) [phone]; Fax: [phone]

## 2020-07-31 ENCOUNTER — Other Ambulatory Visit: Payer: Self-pay

## 2020-07-31 ENCOUNTER — Encounter: Payer: Self-pay | Admitting: Cardiology

## 2020-07-31 ENCOUNTER — Ambulatory Visit: Payer: BC Managed Care – PPO | Admitting: Cardiology

## 2020-07-31 VITALS — BP 102/70 | HR 67 | Ht 72.0 in | Wt 181.0 lb

## 2020-07-31 DIAGNOSIS — Z9861 Coronary angioplasty status: Secondary | ICD-10-CM

## 2020-07-31 DIAGNOSIS — I2510 Atherosclerotic heart disease of native coronary artery without angina pectoris: Secondary | ICD-10-CM | POA: Diagnosis not present

## 2020-07-31 DIAGNOSIS — I255 Ischemic cardiomyopathy: Secondary | ICD-10-CM

## 2020-07-31 DIAGNOSIS — I2102 ST elevation (STEMI) myocardial infarction involving left anterior descending coronary artery: Secondary | ICD-10-CM | POA: Diagnosis not present

## 2020-07-31 DIAGNOSIS — E7841 Elevated Lipoprotein(a): Secondary | ICD-10-CM

## 2020-07-31 DIAGNOSIS — I10 Essential (primary) hypertension: Secondary | ICD-10-CM | POA: Diagnosis not present

## 2020-07-31 NOTE — Progress Notes (Signed)
    Mr. Strole may return to full duty work however will need to make sure that he is taking appropriate breaks and is asking for assistance for heavy lifting. He can lift no more than 50-75lb for now and can slowly increase this based on how he feels.    Georgie Chard NP-C [facility] Pager: [phone]

## 2020-07-31 NOTE — Patient Instructions (Signed)
Medication Instructions:  Your physician recommends that you continue on your current medications as directed. Please refer to the Current Medication list given to you today.  *If you need a refill on your cardiac medications before your next appointment, please call your pharmacy*   Lab Work: TO BE DONE SAME DAY AS ECHO: LIPIDS If you have labs (blood work) drawn today and your tests are completely normal, you will receive your results only by: Marland Kitchen MyChart Message (if you have MyChart) OR . A paper copy in the mail If you have any lab test that is abnormal or we need to change your treatment, we will call you to review the results.   Testing/Procedures: Your physician has requested that you have an echocardiogram. Echocardiography is a painless test that uses sound waves to create images of your heart. It provides your doctor with information about the size and shape of your heart and how well your heart's chambers and valves are working. This procedure takes approximately one hour. There are no restrictions for this procedure.   Follow-Up: At [facility], you and your health needs are our priority.  As part of our continuing mission to provide you with exceptional heart care, we have created designated Provider Care Teams.  These Care Teams include your primary Cardiologist (physician) and Advanced Practice Providers (APPs -  Physician Assistants and Nurse Practitioners) who all work together to provide you with the care you need, when you need it.  We recommend signing up for the patient portal called "MyChart".  Sign up information is provided on this After Visit Summary.  MyChart is used to connect with patients for Virtual Visits (Telemedicine).  Patients are able to view lab/test results, encounter notes, upcoming appointments, etc.  Non-urgent messages can be sent to your provider as well.   To learn more about what you can do with MyChart, go to [email or web address].    Your next  appointment:   8 week(s)  The format for your next appointment:   In Person  Provider:   You may see Tonny Bollman, MD or one of the following Advanced Practice Providers on your designated Care Team:    Tereso Newcomer, PA-C  Vin Bel[place], [place]  Georgie Chard, NP    Other Instructions  Echocardiogram An echocardiogram is a test that uses sound waves (ultrasound) to produce images of the heart. Images from an echocardiogram can provide important information about:  Heart size and shape.  The size and thickness and movement of your heart's walls.  Heart muscle function and strength.  Heart valve function or if you have stenosis. Stenosis is when the heart valves are too narrow.  If blood is flowing backward through the heart valves (regurgitation).  A tumor or infectious growth around the heart valves.  Areas of heart muscle that are not working well because of poor blood flow or injury from a heart attack.  Aneurysm detection. An aneurysm is a weak or damaged part of an artery wall. The wall bulges out from the normal force of blood pumping through the body. Tell a health care provider about:  Any allergies you have.  All medicines you are taking, including vitamins, herbs, eye drops, creams, and over-the-counter medicines.  Any blood disorders you have.  Any surgeries you have had.  Any medical conditions you have.  Whether you are pregnant or may be pregnant. What are the risks? Generally, this is a safe test. However, problems may occur, including an allergic reaction to dye (  contrast) that may be used during the test. What happens before the test? No specific preparation is needed. You may eat and drink normally. What happens during the test?  You will take off your clothes from the waist up and put on a hospital gown.  Electrodes or electrocardiogram (ECG)patches may be placed on your chest. The electrodes or patches are then connected to a device that  monitors your heart rate and rhythm.  You will lie down on a table for an ultrasound exam. A gel will be applied to your chest to help sound waves pass through your skin.  A handheld device, called a transducer, will be pressed against your chest and moved over your heart. The transducer produces sound waves that travel to your heart and bounce back (or "echo" back) to the transducer. These sound waves will be captured in real-time and changed into images of your heart that can be viewed on a video monitor. The images will be recorded on a computer and reviewed by your health care provider.  You may be asked to change positions or hold your breath for a short time. This makes it easier to get different views or better views of your heart.  In some cases, you may receive contrast through an IV in one of your veins. This can improve the quality of the pictures from your heart. The procedure may vary among health care providers and hospitals.   What can I expect after the test? You may return to your normal, everyday life, including diet, activities, and medicines, unless your health care provider tells you not to do that. Follow these instructions at home:  It is up to you to get the results of your test. Ask your health care provider, or the department that is doing the test, when your results will be ready.  Keep all follow-up visits. This is important. Summary  An echocardiogram is a test that uses sound waves (ultrasound) to produce images of the heart.  Images from an echocardiogram can provide important information about the size and shape of your heart, heart muscle function, heart valve function, and other possible heart problems.  You do not need to do anything to prepare before this test. You may eat and drink normally.  After the echocardiogram is completed, you may return to your normal, everyday life, unless your health care provider tells you not to do that. This information is  not intended to replace advice given to you by your health care provider. Make sure you discuss any questions you have with your health care provider. Document Revised: 01/23/2020 Document Reviewed: 01/23/2020 Elsevier Patient Education  2021 [employer].

## 2020-08-15 ENCOUNTER — Other Ambulatory Visit: Payer: Self-pay

## 2020-08-15 ENCOUNTER — Ambulatory Visit (HOSPITAL_COMMUNITY): Payer: BC Managed Care – PPO | Attending: Cardiology

## 2020-08-15 ENCOUNTER — Other Ambulatory Visit: Payer: BC Managed Care – PPO | Admitting: *Deleted

## 2020-08-15 DIAGNOSIS — I255 Ischemic cardiomyopathy: Secondary | ICD-10-CM

## 2020-08-15 LAB — ECHOCARDIOGRAM COMPLETE
Area-P 1/2: 2.73 cm2
S' Lateral: 2.8 cm

## 2020-09-11 ENCOUNTER — Other Ambulatory Visit: Payer: Self-pay

## 2020-09-11 MED ORDER — METOPROLOL TARTRATE 25 MG PO TABS: 12.5000 mg | ORAL_TABLET | Freq: Two times a day (BID) | ORAL | 3 refills | Status: DC

## 2020-09-11 NOTE — Telephone Encounter (Signed)
Pt's medication was sent to pt's pharmacy as requested. Confirmation received.  °

## 2020-09-24 ENCOUNTER — Ambulatory Visit: Payer: BC Managed Care – PPO | Admitting: Physician Assistant

## 2020-10-20 NOTE — Progress Notes (Addendum)
Cardiology Office Note:    Date:  10/21/2020   ID:  Martin Johnston, DOB [birth year], MRN [id number]  PCP:  Patient, No Pcp Per (Inactive)   [facility] Prov[facility] Cardiologist:  Sherren Mocha, MD     Referring MD: No ref. provider found   Chief Complaint:  Follow-up (CAD)    Patient Profile:    Martin Johnston is a 45 y.o. male with:   Coronary artery disease   Anterolateral STEMI in 06/2020 s/p DES to pLAD   Ischemic Cardiomyopathy  Echocardiogram 1/22: EF 40-45  Echocardiogram 3/22: EF 50-55  GDMT limited by low BP  Hyperlipidemia   +Cigs  Prior CV studies: Echocardiogram 08/15/20 EF 50-55, no RWMA, GLS -15.7%, normal RVSF, trivial MR, mild-moderate AV sclerosis without stenosis  Echocardiogram 06/20/20 EF 40-45, normal RVSF  LEFT HEART CATH AND CORONARY ANGIOGRAPHY 06/19/2020 Narrative  Prox LAD to Mid LAD lesion is 70% stenosed.  Mid LAD lesion is 50% stenosed.  A drug-eluting stent was successfully placed using a STENT RESOLUTE ONYX 3.5X15.  Post intervention, there is a 0% residual stenosis. 1.  Severe single-vessel coronary artery disease with thrombotic proximal LAD lesion, treated successfully with direct stenting using a 3.5 x 15 mm resolute Onyx DES, postdilated with a 3.75 mm noncompliant balloon with intravascular ultrasound guidance 2.  Widely patent left mainstem, left circumflex, and RCA with mild nonobstructive plaquing noted 3.  Moderate nonobstructive mid LAD stenosis after the second diagonal branch, appropriate for medical therapy  History of Present Illness: Mr. Martin Johnston was last seen in 07/2020 by Martin Drown, NP.  He returns for f/u.  He is here today with his wife.  He has been doing well.  He has not had chest pain, shortness of breath, syncope, orthopnea, leg edema.  He is back at work without limitations.        Past Medical History:  Diagnosis Date  . CAD S/P percutaneous coronary angioplasty    Anterolateral STEMI in  06/2020 s/p DES to pLAD   . HLD (hyperlipidemia)   . Ischemic cardiomyopathy    Echo 1/22: EF 40-45 // Echocardiogram 3/22: EF 50-55   . Tobacco abuse     Current Medications: Current Meds  Medication Sig  . acetaminophen (TYLENOL) 500 MG tablet Take 500-1,500 mg by mouth daily as needed (chest pain).  Marland Kitchen aspirin EC 81 MG tablet Take 81 mg by mouth daily. Swallow whole.  Marland Kitchen atorvastatin (LIPITOR) 80 MG tablet Take 1 tablet (80 mg total) by mouth daily.  Marland Kitchen lisinopril (ZESTRIL) 2.5 MG tablet Take 2.5 mg by mouth daily.  . metoprolol tartrate (LOPRESSOR) 25 MG tablet Take 0.5 tablets (12.5 mg total) by mouth 2 (two) times daily.  . nitroGLYCERIN (NITROSTAT) 0.4 MG SL tablet PLACE 1 TABLET (0.4 MG TOTAL) UNDER THE TONGUE EVERY FIVE MINUTES X 3 DOSES AS NEEDED FOR CHEST PAIN.  Marland Kitchen ticagrelor (BRILINTA) 90 MG TABS tablet Take 1 tablet (90 mg total) by mouth 2 (two) times daily.  . [DISCONTINUED] aspirin 81 MG EC tablet TAKE 1 TABLET (81 MG TOTAL) BY MOUTH DAILY. SWALLOW WHOLE. (Patient taking differently: Take by mouth 5 (five) times daily.)  . [DISCONTINUED] lisinopril (ZESTRIL) 2.5 MG tablet Take 1 tablet (2.5 mg total) by mouth daily.     Allergies:   Penicillins and Vicodin [hydrocodone-acetaminophen]   Social History   Tobacco Use  . Smoking status: Former Smoker    Packs/day: 0.50  . Smokeless tobacco: Former Network engineer  . Vaping  Use: Never used  Substance Use Topics  . Alcohol use: No    Alcohol/week: 0.0 standard drinks  . Drug use: No     Family Hx: The patient's family history includes Cancer in his mother; Diabetes in his father; Heart disease in his father; Hypertension in his father.  Review of Systems  Cardiovascular: Negative for claudication.     EKGs/Labs/Other Test Reviewed:    EKG:  EKG is not ordered today.  The ekg ordered today demonstrates n/a  Recent Labs: 06/20/2020: Hemoglobin 15.5; Platelets 258 10/21/2020: ALT 23; BUN 10; Creatinine, Ser 1.09;  Potassium 4.4; Sodium 137   Recent Lipid Panel Lab Results  Component Value Date/Time   CHOL 95 (L) 10/21/2020 08:49 AM   TRIG 133 10/21/2020 08:49 AM   HDL 30 (L) 10/21/2020 08:49 AM   CHOLHDL 3.2 10/21/2020 08:49 AM   CHOLHDL 6.0 06/20/2020 12:16 AM   LDLCALC 41 10/21/2020 08:49 AM      Risk Assessment/Calculations:      Physical Exam:    VS:  BP 100/62   Pulse 64   Ht 6' (1.829 m)   Wt 170 lb (77.1 kg)   SpO2 98%   BMI 23.06 kg/m     Wt Readings from Last 3 Encounters:  10/21/20 170 lb (77.1 kg)  07/31/20 181 lb (82.1 kg)  07/04/20 182 lb (82.6 kg)     Constitutional:      Appearance: Healthy appearance. Not in distress.  Neck:     Vascular: No carotid bruit. JVD normal.  Pulmonary:     Effort: Pulmonary effort is normal.     Breath sounds: No wheezing. No rales.  Cardiovascular:     Normal rate. Regular rhythm. Normal S1. Normal S2.     Murmurs: There is no murmur.  Pulses:    Intact distal pulses.  Edema:    Peripheral edema absent.  Abdominal:     Palpations: Abdomen is soft. There is no hepatomegaly.  Skin:    General: Skin is warm and dry.  Neurological:     General: No focal deficit present.     Mental Status: Alert and oriented to person, place and time.     Cranial Nerves: Cranial nerves are intact.          ASSESSMENT & PLAN:    1. Coronary artery disease involving native coronary artery of native heart without angina pectoris Status post anterolateral STEMI in January 2022 treated with DES to the LAD.  He is doing well without anginal symptoms.  Continue aspirin, ticagrelor, atorvastatin, lisinopril, metoprolol.  Follow-up with Dr. Burt Knack in January 2023.  2. Ischemic cardiomyopathy EF was 40-45 post MI.  This recovered to normal with EF 50-55 on echocardiogram 3/22.  Continue beta-blocker, ACE inhibitor  3. Mixed hyperlipidemia Continue high intensity statin therapy.  Obtain follow-up c-Met, lipid panel     Dispo:  Return in about  8 months (around 06/23/2021) for Routine Follow Up, w/ Dr. Burt Knack.   Medication Adjustments/Labs and Tests Ordered: Current medicines are reviewed at length with the patient today.  Concerns regarding medicines are outlined above.  Tests Ordered: Orders Placed This Encounter  Procedures  . Comprehensive metabolic panel  . Lipid panel   Medication Changes: No orders of the defined types were placed in this encounter.   Signed, Richardson Dopp, PA-C  10/21/2020 4:51 PM    [facility] [address], [place], [place]  [address] Phone: [phone]; Fax: [phone]

## 2020-10-21 ENCOUNTER — Ambulatory Visit: Payer: BC Managed Care – PPO | Admitting: Physician Assistant

## 2020-10-21 ENCOUNTER — Encounter: Payer: Self-pay | Admitting: Physician Assistant

## 2020-10-21 VITALS — BP 100/62 | HR 64 | Ht 72.0 in | Wt 170.0 lb

## 2020-10-21 DIAGNOSIS — E782 Mixed hyperlipidemia: Secondary | ICD-10-CM

## 2020-10-21 DIAGNOSIS — I2510 Atherosclerotic heart disease of native coronary artery without angina pectoris: Secondary | ICD-10-CM | POA: Diagnosis not present

## 2020-10-21 DIAGNOSIS — I255 Ischemic cardiomyopathy: Secondary | ICD-10-CM | POA: Diagnosis not present

## 2020-10-21 DIAGNOSIS — I10 Essential (primary) hypertension: Secondary | ICD-10-CM

## 2020-10-21 LAB — COMPREHENSIVE METABOLIC PANEL
ALT: 23 IU/L (ref 0–44)
AST: 19 IU/L (ref 0–40)
Albumin/Globulin Ratio: 1.9 (ref 1.2–2.2)
Albumin: 4.7 g/dL (ref 4.0–5.0)
Alkaline Phosphatase: 134 IU/L — ABNORMAL HIGH (ref 44–121)
BUN/Creatinine Ratio: 9 (ref 9–20)
BUN: 10 mg/dL (ref 6–24)
Bilirubin Total: 0.5 mg/dL (ref 0.0–1.2)
CO2: 23 mmol/L (ref 20–29)
Calcium: 9.6 mg/dL (ref 8.7–10.2)
Chloride: 102 mmol/L (ref 96–106)
Creatinine, Ser: 1.09 mg/dL (ref 0.76–1.27)
Globulin, Total: 2.5 g/dL (ref 1.5–4.5)
Glucose: 84 mg/dL (ref 65–99)
Potassium: 4.4 mmol/L (ref 3.5–5.2)
Sodium: 137 mmol/L (ref 134–144)
Total Protein: 7.2 g/dL (ref 6.0–8.5)
eGFR: 85 mL/min/{1.73_m2} (ref >59)

## 2020-10-21 LAB — LIPID PANEL
Chol/HDL Ratio: 3.2 ratio (ref 0.0–5.0)
Cholesterol, Total: 95 mg/dL — ABNORMAL LOW (ref 100–199)
HDL: 30 mg/dL — ABNORMAL LOW (ref >39)
LDL Chol Calc (NIH): 41 mg/dL (ref 0–99)
Triglycerides: 133 mg/dL (ref 0–149)
VLDL Cholesterol Cal: 24 mg/dL (ref 5–40)

## 2020-10-21 NOTE — Patient Instructions (Signed)
Medication Instructions:  Your physician recommends that you continue on your current medications as directed. Please refer to the Current Medication list given to you today.  *If you need a refill on your cardiac medications before your next appointment, please call your pharmacy*   Lab Work: TODAY: CMET, LIPIDS  If you have labs (blood work) drawn today and your tests are completely normal, you will receive your results only by: Marland Kitchen MyChart Message (if you have MyChart) OR . A paper copy in the mail If you have any lab test that is abnormal or we need to change your treatment, we will call you to review the results.   Testing/Procedures: None   Follow-Up: At [facility], you and your health needs are our priority.  As part of our continuing mission to provide you with exceptional heart care, we have created designated Provider Care Teams.  These Care Teams include your primary Cardiologist (physician) and Advanced Practice Providers (APPs -  Physician Assistants and Nurse Practitioners) who all work together to provide you with the care you need, when you need it.  We recommend signing up for the patient portal called "MyChart".  Sign up information is provided on this After Visit Summary.  MyChart is used to connect with patients for Virtual Visits (Telemedicine).  Patients are able to view lab/test results, encounter notes, upcoming appointments, etc.  Non-urgent messages can be sent to your provider as well.   To learn more about what you can do with MyChart, go to [email or web address].    Your next appointment:   8 month(s)  The format for your next appointment:   In Person  Provider:   Tonny Bollman, MD   Other Instructions None

## 2020-11-20 ENCOUNTER — Other Ambulatory Visit: Payer: Self-pay | Admitting: Cardiology

## 2021-04-14 ENCOUNTER — Other Ambulatory Visit: Payer: Self-pay | Admitting: Cardiology

## 2021-05-19 ENCOUNTER — Other Ambulatory Visit: Payer: Self-pay

## 2021-05-19 ENCOUNTER — Emergency Department (HOSPITAL_BASED_OUTPATIENT_CLINIC_OR_DEPARTMENT_OTHER)
Admission: EM | Admit: 2021-05-19 | Discharge: 2021-05-19 | Disposition: A | Discharge status: Home / Self Care | Payer: BC Managed Care – PPO | Attending: Emergency Medicine | Admitting: Emergency Medicine

## 2021-05-19 ENCOUNTER — Encounter (HOSPITAL_BASED_OUTPATIENT_CLINIC_OR_DEPARTMENT_OTHER): Payer: Self-pay | Admitting: Emergency Medicine

## 2021-05-19 DIAGNOSIS — Z7982 Long term (current) use of aspirin: Secondary | ICD-10-CM | POA: Diagnosis not present

## 2021-05-19 DIAGNOSIS — X509XXA Other and unspecified overexertion or strenuous movements or postures, initial encounter: Secondary | ICD-10-CM | POA: Insufficient documentation

## 2021-05-19 DIAGNOSIS — Y990 Civilian activity done for income or pay: Secondary | ICD-10-CM | POA: Diagnosis not present

## 2021-05-19 DIAGNOSIS — M545 Low back pain, unspecified: Secondary | ICD-10-CM

## 2021-05-19 DIAGNOSIS — Z79899 Other long term (current) drug therapy: Secondary | ICD-10-CM | POA: Diagnosis not present

## 2021-05-19 DIAGNOSIS — Z87891 Personal history of nicotine dependence: Secondary | ICD-10-CM | POA: Diagnosis not present

## 2021-05-19 DIAGNOSIS — Y9289 Other specified places as the place of occurrence of the external cause: Secondary | ICD-10-CM | POA: Insufficient documentation

## 2021-05-19 DIAGNOSIS — Z951 Presence of aortocoronary bypass graft: Secondary | ICD-10-CM | POA: Insufficient documentation

## 2021-05-19 DIAGNOSIS — I2510 Atherosclerotic heart disease of native coronary artery without angina pectoris: Secondary | ICD-10-CM | POA: Diagnosis not present

## 2021-05-19 DIAGNOSIS — M5459 Other low back pain: Secondary | ICD-10-CM | POA: Diagnosis not present

## 2021-05-19 MED ORDER — METHOCARBAMOL 500 MG PO TABS
500.0000 mg | ORAL_TABLET | Freq: Once | ORAL | Status: AC
  Administered 2021-05-19: 500 mg via ORAL
  Filled 2021-05-19: qty 1

## 2021-05-19 MED ORDER — METHOCARBAMOL 500 MG PO TABS: 500.0000 mg | ORAL_TABLET | Freq: Two times a day (BID) | ORAL | 0 refills | Status: DC

## 2021-05-19 NOTE — ED Provider Notes (Signed)
[facility] EMERGENCY DEPARTMENT Provider Note  CSN: [id number] Arrival date & time: 05/19/21 1623    History Chief Complaint  Patient presents with   Back Pain    Martin Johnston is a 45 y.o. male with history of CAD reports he was lifting a heavy object at work today when he had sudden onset of R lower back pain, worse with movement. Not radiating. No numbness, tingling weakness in leg. No incontinence.    Past Medical History:  Diagnosis Date   CAD S/P percutaneous coronary angioplasty    Anterolateral STEMI in 06/2020 s/p DES to pLAD    HLD (hyperlipidemia)    Ischemic cardiomyopathy    Echo 1/22: EF 40-45 // Echocardiogram 3/22: EF 50-55    Tobacco abuse     Past Surgical History:  Procedure Laterality Date   CORONARY/GRAFT ACUTE MI REVASCULARIZATION N/A 06/19/2020   Procedure: Coronary/Graft Acute MI Revascularization;  Surgeon: Tonny Bollman, MD;  Location: [facility] INVASIVE CV LAB;  Service: Cardiovascular;  Laterality: N/A;   INTRAVASCULAR ULTRASOUND/IVUS N/A 06/19/2020   Procedure: Intravascular Ultrasound/IVUS;  Surgeon: Tonny Bollman, MD;  Location: [facility] INVASIVE CV LAB;  Service: Cardiovascular;  Laterality: N/A;   LEFT HEART CATH AND CORONARY ANGIOGRAPHY N/A 06/19/2020   Procedure: LEFT HEART CATH AND CORONARY ANGIOGRAPHY;  Surgeon: Tonny Bollman, MD;  Location: [facility] INVASIVE CV LAB;  Service: Cardiovascular;  Laterality: N/A;   NECK SURGERY     "whole in the throat" fixed   throat sugery      Family History  Problem Relation Age of Onset   Cancer Mother    Heart disease Father    Diabetes Father    Hypertension Father     Social History   Tobacco Use   Smoking status: Former    Packs/day: 0.50    Types: Cigarettes   Smokeless tobacco: Former  Building services engineer Use: Never used  Substance Use Topics   Alcohol use: No    Alcohol/week: 0.0 standard drinks   Drug use: No     Home Medications Prior to Admission medications   Medication  Sig Start Date End Date Taking? Authorizing Provider  methocarbamol (ROBAXIN) 500 MG tablet Take 1 tablet (500 mg total) by mouth 2 (two) times daily. 05/19/21  Yes Pollyann Savoy, MD  acetaminophen (TYLENOL) 500 MG tablet Take 500-1,500 mg by mouth daily as needed (chest pain).    [provider]  aspirin EC 81 MG tablet Take 81 mg by mouth daily. Swallow whole.    [provider]  atorvastatin (LIPITOR) 80 MG tablet Take 1 tablet by mouth once daily 11/20/20   Tonny Bollman, MD  BRILINTA 90 MG TABS tablet Take 1 tablet by mouth twice daily 04/14/21   Laverda Page B, NP  lisinopril (ZESTRIL) 2.5 MG tablet Take 2.5 mg by mouth daily.    [provider]  metoprolol tartrate (LOPRESSOR) 25 MG tablet Take 0.5 tablets (12.5 mg total) by mouth 2 (two) times daily. 09/11/20   Tonny Bollman, MD  nitroGLYCERIN (NITROSTAT) 0.4 MG SL tablet PLACE 1 TABLET (0.4 MG TOTAL) UNDER THE TONGUE EVERY FIVE MINUTES X 3 DOSES AS NEEDED FOR CHEST PAIN. 06/21/20 06/21/21  Laverda Page B, NP     Allergies    Penicillins and Vicodin [hydrocodone-acetaminophen]   Review of Systems   Review of Systems A comprehensive review of systems was completed and negative except as noted in HPI.    Physical Exam BP 110/67 (BP Location: Right  Arm)   Pulse (!) 52   Temp 97.9 F (36.6 C) (Temporal)   Resp 16   Ht 6' (1.829 m)   Wt 76.2 kg   SpO2 100%   BMI 22.78 kg/m   Physical Exam Vitals and nursing note reviewed.  Constitutional:      Appearance: Normal appearance.  HENT:     Head: Normocephalic and atraumatic.     Nose: Nose normal.     Mouth/Throat:     Mouth: Mucous membranes are moist.  Eyes:     Extraocular Movements: Extraocular movements intact.     Conjunctiva/sclera: Conjunctivae normal.  Cardiovascular:     Rate and Rhythm: Normal rate.  Pulmonary:     Effort: Pulmonary effort is normal.     Breath sounds: Normal breath sounds.  Abdominal:     General:  Abdomen is flat.     Palpations: Abdomen is soft.     Tenderness: There is no abdominal tenderness.  Musculoskeletal:        General: Tenderness (mild R lumbar paraspinal muscles) present. No swelling. Normal range of motion.     Cervical back: Neck supple.  Skin:    General: Skin is warm and dry.  Neurological:     General: No focal deficit present.     Mental Status: He is alert.     Motor: No weakness.     Gait: Gait normal.     Deep Tendon Reflexes: Reflexes normal.  Psychiatric:        Mood and Affect: Mood normal.     ED Results / Procedures / Treatments   Labs (all labs ordered are listed, but only abnormal results are displayed) Labs Reviewed - No data to display  EKG None   Radiology No results found.  Procedures Procedures  Medications Ordered in the ED Medications  methocarbamol (ROBAXIN) tablet 500 mg (has no administration in time range)     MDM Rules/Calculators/A&P MDM  Patient with uncomplicated MSK back pain. Avoid NSAIDs due to CAD. APAP for pain, robaxin for muscle spasm. Rest and heat at home. PCP follow up if not improving.  ED Course  I have reviewed the triage vital signs and the nursing notes.  Pertinent labs & imaging results that were available during my care of the patient were reviewed by me and considered in my medical decision making (see chart for details).     Final Clinical Impression(s) / ED Diagnoses Final diagnoses:  Acute right-sided low back pain without sciatica    Rx / DC Orders ED Discharge Orders          Ordered    methocarbamol (ROBAXIN) 500 MG tablet  2 times daily        05/19/21 2023             Pollyann Savoy, MD 05/19/21 2023

## 2021-05-19 NOTE — ED Triage Notes (Signed)
Pt arrives to ED with c/o of right sided flank pain. 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 He felt like he may of twisted the wrong way. The pain has been intermittent and described as sharp. The pain is 0/10 when he sits but 8/10 when he bends/twist. Pt denies hematuria, urinary frequency/urgency. No N/V.

## 2021-05-29 ENCOUNTER — Other Ambulatory Visit: Payer: Self-pay | Admitting: Cardiology

## 2021-06-30 DIAGNOSIS — I255 Ischemic cardiomyopathy: Secondary | ICD-10-CM | POA: Insufficient documentation

## 2021-06-30 NOTE — Progress Notes (Signed)
Cardiology Office Note:    Date:  07/01/2021   ID:  Martin Johnston, DOB [birth year], MRN [id number]  PCP:  Patient, No Pcp Per (Inactive)  [facility] Providers Cardiologist:  Sherren Mocha, MD Cardiology APP:  Sharmon Revere    Referring MD: No ref. provider found   Chief Complaint:  Follow-up for CAD    Patient Profile: Coronary artery disease  Anterolateral STEMI in 06/2020 s/p DES to pLAD  Ischemic Cardiomyopathy Echocardiogram 1/22: EF 40-45 Echocardiogram 3/22: EF 50-55 GDMT limited by low BP Hyperlipidemia  +Cigs  Prior CV Studies: Echocardiogram 08/15/20 EF 50-55, no RWMA, GLS -15.7%, normal RVSF, trivial MR, mild-moderate AV sclerosis without stenosis   Echocardiogram 06/20/20 EF 40-45, normal RVSF   LEFT HEART CATH AND CORONARY ANGIOGRAPHY 06/19/2020 LAD prox to mid 70 (thrombotic), mid 50 LM patent LCx patent  RCA patent PCI:  3.5 x 15 mm Resolute Onyx DES to LAD   History of Present Illness:   Martin Johnston is a 46 y.o. male with the above problem list.  He was last seen in 5/22.  He returns for f/u.  He is here alone.  He is doing well without chest pain, shortness of breath, syncope, orthopnea, leg edema.  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  He has started smoking again.        Past Medical History:  Diagnosis Date   CAD S/P percutaneous coronary angioplasty    Anterolateral STEMI in 06/2020 s/p DES to pLAD    HLD (hyperlipidemia)    Ischemic cardiomyopathy    Echo 1/22: EF 40-45 // Echocardiogram 3/22: EF 50-55    Tobacco abuse    Current Medications: Current Meds  Medication Sig   acetaminophen (TYLENOL) 500 MG tablet Take 500-1,500 mg by mouth daily as needed (chest pain).   aspirin EC 81 MG tablet Take 81 mg by mouth daily. Swallow whole.   nitroGLYCERIN (NITROSTAT) 0.4 MG SL tablet PLACE 1 TABLET (0.4 MG TOTAL) UNDER THE TONGUE EVERY FIVE MINUTES X 3 DOSES AS NEEDED FOR CHEST PAIN.    [DISCONTINUED] atorvastatin (LIPITOR) 80 MG tablet Take 1 tablet by mouth once daily   [DISCONTINUED] BRILINTA 90 MG TABS tablet Take 1 tablet by mouth twice daily   [DISCONTINUED] lisinopril (ZESTRIL) 2.5 MG tablet Take 1 tablet by mouth once daily   [DISCONTINUED] metoprolol tartrate (LOPRESSOR) 25 MG tablet Take 0.5 tablets (12.5 mg total) by mouth 2 (two) times daily.    Allergies:   Penicillins and Vicodin [hydrocodone-acetaminophen]   Social History   Tobacco Use   Smoking status: Every Day    Packs/day: 0.50    Types: Cigarettes   Smokeless tobacco: Former  Scientific laboratory technician Use: Never used  Substance Use Topics   Alcohol use: No    Alcohol/week: 0.0 standard drinks   Drug use: No    Family Hx: The patient's family history includes Cancer in his mother; Diabetes in his father; Heart disease in his father; Hypertension in his father.  Review of Systems  Gastrointestinal:  Negative for hematochezia.  Genitourinary:  Negative for hematuria.    EKGs/Labs/Other Test Reviewed:    EKG:  EKG is   ordered today.  The ekg ordered today demonstrates NSR, HR 69, normal axis, RSR prime V1, QTC 390  Recent Labs: 10/21/2020: ALT 23; BUN 10; Creatinine, Ser 1.09; Potassium 4.4; Sodium 137   Recent Lipid Panel Recent Labs  10/21/20 0849  CHOL 95*  TRIG 133  HDL 30*  LDLCALC 41     Risk Assessment/Calculations:         Physical Exam:    VS:  BP 110/60    Pulse 69    Ht 6' (1.829 m)    Wt 169 lb (76.7 kg)    SpO2 98%    BMI 22.92 kg/m     Wt Readings from Last 3 Encounters:  07/01/21 169 lb (76.7 kg)  05/19/21 168 lb (76.2 kg)  10/21/20 170 lb (77.1 kg)    Constitutional:      Appearance: Healthy appearance. Not in distress.  Neck:     Vascular: No JVR. JVD normal.  Pulmonary:     Effort: Pulmonary effort is normal.     Breath sounds: No wheezing. No rales.  Cardiovascular:     Normal rate. Regular rhythm. Normal S1. Normal S2.      Murmurs: There is no  murmur.  Edema:    Peripheral edema absent.  Abdominal:     Palpations: Abdomen is soft. There is no hepatomegaly.  Skin:    General: Skin is warm and dry.  Neurological:     General: No focal deficit present.     Mental Status: Alert and oriented to person, place and time.     Cranial Nerves: Cranial nerves are intact.        ASSESSMENT & PLAN:   CAD (coronary artery disease) Status post anterolateral STEMI in January 2022 treated with DES to the LAD.    He is doing well without anginal symptoms.  Electrocardiogram does not demonstrate any acute changes.  Continue aspirin 81 mg daily, atorvastatin 80 mg daily, lisinopril 2.5 mg daily, metoprolol tartrate 12.5 mg twice daily.  It has been more than 1 year since his STEMI.  We discussed decreasing ticagrelor to 60 mg twice daily versus changing over to clopidogrel.  He is comfortable taking ticagrelor.  Decrease ticagrelor to 60 mg twice daily.  Follow-up 1 year.  Hyperlipidemia LDL optimal by most recent labs.  Continue atorvastatin 80 mg daily.  Arrange follow-up fasting CMET, lipids prior to next visit.  Ischemic cardiomyopathy EF was 40-45 post MI and returned to normal by echocardiogram in March 2022.  Continue lisinopril 2.5 mg daily, metoprolol tartrate 12.5 mg twice daily.  Tobacco abuse We discussed the importance of quitting.  I have recommended cessation.         Dispo:  Return in about 1 year (around 07/01/2022) for Routine follow up in 1 year with Dr. Burt Knack or Richardson Dopp, PA-C..   Medication Adjustments/Labs and Tests Ordered: Current medicines are reviewed at length with the patient today.  Concerns regarding medicines are outlined above.  Tests Ordered: Orders Placed This Encounter  Procedures   Comp Met (CMET)   Lipid Profile   EKG 12-Lead   Medication Changes: Meds ordered this encounter  Medications   atorvastatin (LIPITOR) 80 MG tablet    Sig: Take 1 tablet (80 mg total) by mouth daily.    Dispense:  90  tablet    Refill:  3   lisinopril (ZESTRIL) 2.5 MG tablet    Sig: Take 1 tablet (2.5 mg total) by mouth daily.    Dispense:  90 tablet    Refill:  3   metoprolol tartrate (LOPRESSOR) 25 MG tablet    Sig: Take 0.5 tablets (12.5 mg total) by mouth 2 (two) times daily.    Dispense:  90 tablet  Refill:  3   ticagrelor (BRILINTA) 60 MG TABS tablet    Sig: Take 1 tablet (60 mg total) by mouth 2 (two) times daily.    Dispense:  180 tablet    Refill:  3   Signed, Richardson Dopp, PA-C  07/01/2021 9:12 AM    [facility] [address], [place], [place]  [address] Phone: [phone]; Fax: [phone]

## 2021-07-01 ENCOUNTER — Ambulatory Visit: Payer: BC Managed Care – PPO | Admitting: Physician Assistant

## 2021-07-01 ENCOUNTER — Other Ambulatory Visit: Payer: Self-pay

## 2021-07-01 ENCOUNTER — Encounter: Payer: Self-pay | Admitting: Physician Assistant

## 2021-07-01 VITALS — BP 110/60 | HR 69 | Ht 72.0 in | Wt 169.0 lb

## 2021-07-01 DIAGNOSIS — Z720 Tobacco use: Secondary | ICD-10-CM

## 2021-07-01 DIAGNOSIS — I2510 Atherosclerotic heart disease of native coronary artery without angina pectoris: Secondary | ICD-10-CM

## 2021-07-01 DIAGNOSIS — I255 Ischemic cardiomyopathy: Secondary | ICD-10-CM | POA: Diagnosis not present

## 2021-07-01 DIAGNOSIS — E782 Mixed hyperlipidemia: Secondary | ICD-10-CM

## 2021-07-01 MED ORDER — LISINOPRIL 2.5 MG PO TABS: 2.5000 mg | ORAL_TABLET | Freq: Every day | ORAL | 3 refills | Status: DC

## 2021-07-01 MED ORDER — ATORVASTATIN CALCIUM 80 MG PO TABS: 80.0000 mg | ORAL_TABLET | Freq: Every day | ORAL | 3 refills | Status: DC

## 2021-07-01 MED ORDER — TICAGRELOR 60 MG PO TABS: 60.0000 mg | ORAL_TABLET | Freq: Two times a day (BID) | ORAL | 3 refills | Status: AC

## 2021-07-01 MED ORDER — METOPROLOL TARTRATE 25 MG PO TABS: 12.5000 mg | ORAL_TABLET | Freq: Two times a day (BID) | ORAL | 3 refills | Status: DC

## 2021-07-01 NOTE — Assessment & Plan Note (Signed)
Status post anterolateral STEMI in January 2022 treated with DES to the LAD.    He is doing well without anginal symptoms.  Electrocardiogram does not demonstrate any acute changes.  Continue aspirin 81 mg daily, atorvastatin 80 mg daily, lisinopril 2.5 mg daily, metoprolol tartrate 12.5 mg twice daily.  It has been more than 1 year since his STEMI.  We discussed decreasing ticagrelor to 60 mg twice daily versus changing over to clopidogrel.  He is comfortable taking ticagrelor.  Decrease ticagrelor to 60 mg twice daily.  Follow-up 1 year.

## 2021-07-01 NOTE — Assessment & Plan Note (Signed)
LDL optimal by most recent labs.  Continue atorvastatin 80 mg daily.  Arrange follow-up fasting CMET, lipids prior to next visit.

## 2021-07-01 NOTE — Patient Instructions (Signed)
Medication Instructions:   DECREASE Brilinta one (1) tablet by mouth ( 60 mg) twice daily.  *If you need a refill on your cardiac medications before your next appointment, please call your pharmacy*   Lab Work:  January 3 You can come in on the day of your appointment anytime between 7:30-4:30 fasting from midnight the night before.   If you have labs (blood work) drawn today and your tests are completely normal, you will receive your results only by: MyChart Message (if you have MyChart) OR A paper copy in the mail If you have any lab test that is abnormal or we need to change your treatment, we will call you to review the results.   Testing/Procedures:  -None   Follow-Up: At [facility], you and your health needs are our priority.  As part of our continuing mission to provide you with exceptional heart care, we have created designated Provider Care Teams.  These Care Teams include your primary Cardiologist (physician) and Advanced Practice Providers (APPs -  Physician Assistants and Nurse Practitioners) who all work together to provide you with the care you need, when you need it.  We recommend signing up for the patient portal called "MyChart".  Sign up information is provided on this After Visit Summary.  MyChart is used to connect with patients for Virtual Visits (Telemedicine).  Patients are able to view lab/test results, encounter notes, upcoming appointments, etc.  Non-urgent messages can be sent to your provider as well.   To learn more about what you can do with MyChart, go to [email or web address].    Your next appointment:   1 year(s)  The format for your next appointment:   In Person  Provider:   Tereso Newcomer, PA-C  or Tonny Bollman, MD .    Other Instructions Your physician wants you to follow-up in: 1 year with Dr. Excell Seltzer or Tereso Newcomer, PA-C.  You will receive a reminder letter in the mail two months in advance. If you don't receive a letter, please call  our office to schedule the follow-up appointment.

## 2021-07-01 NOTE — Assessment & Plan Note (Signed)
EF was 40-45 post MI and returned to normal by echocardiogram in March 2022.  Continue lisinopril 2.5 mg daily, metoprolol tartrate 12.5 mg twice daily.

## 2021-07-01 NOTE — Assessment & Plan Note (Signed)
We discussed the importance of quitting.  I have recommended cessation. °

## 2021-07-12 IMAGING — DX DG CHEST 2V
2 series · 2 of 2 positions shown · non-contrast
Comparison: 11/15/2017

CLINICAL DATA: Left retroauricular pain for 3 days, lymphadenitis

EXAM:
CHEST - 2 VIEW

[chest pa]
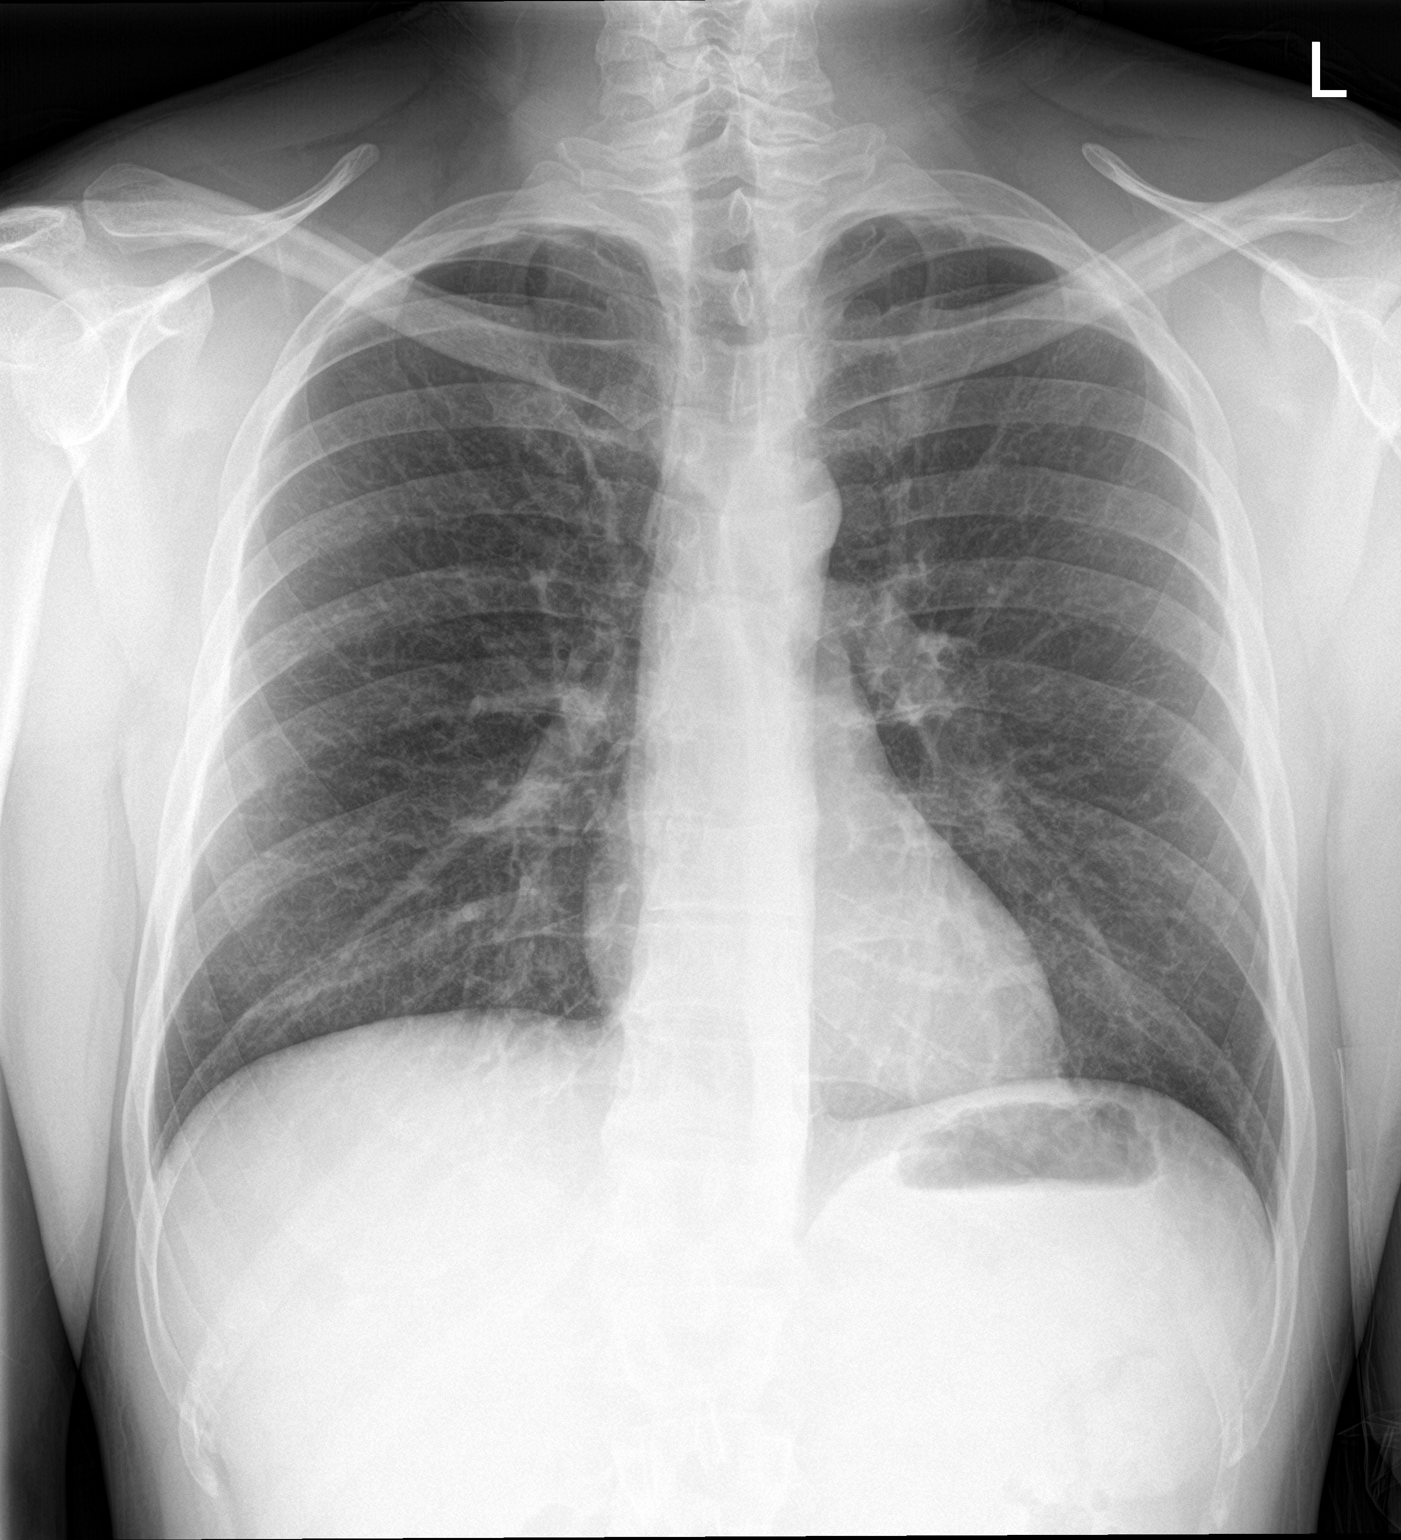

[chest lat]
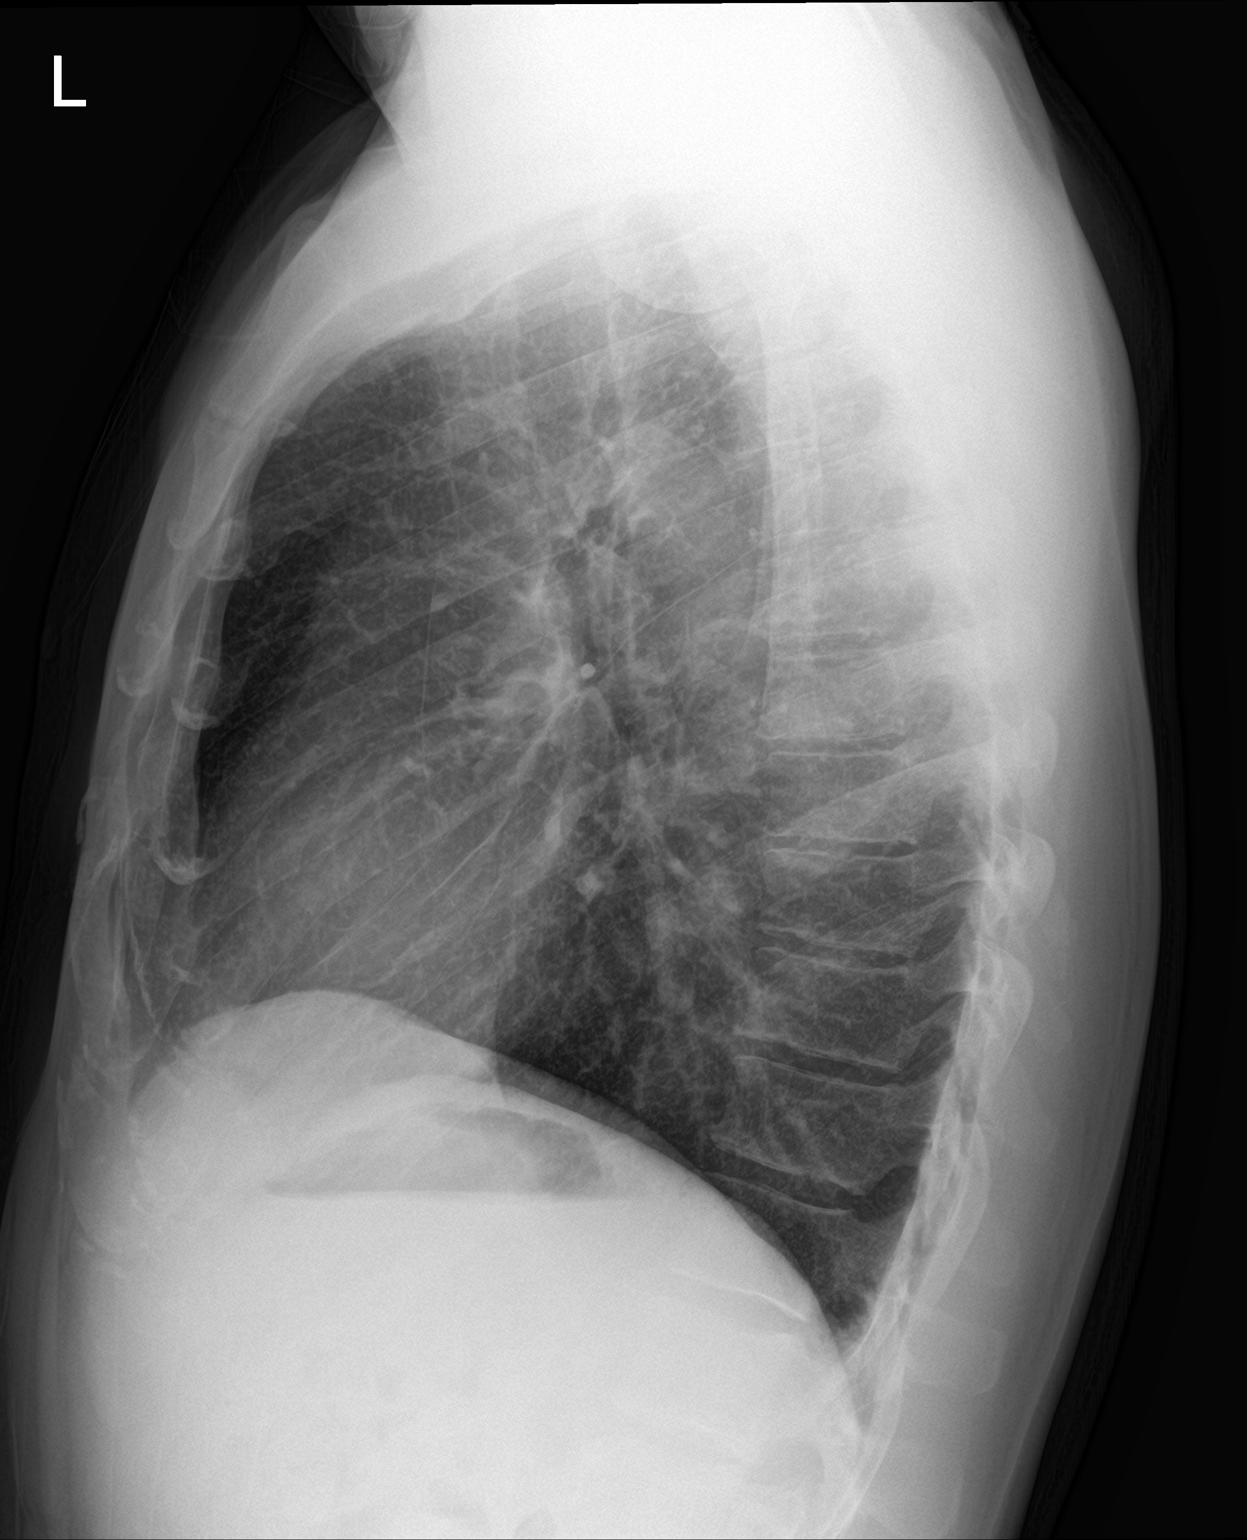

[2 of 2 positions shown; findings below may reference images not displayed]

FINDINGS: The heart size and mediastinal contours are within normal limits.
Both lungs are clear. The visualized skeletal structures are
unremarkable.
IMPRESSION: No active cardiopulmonary disease.

## 2021-09-12 ENCOUNTER — Other Ambulatory Visit: Payer: Self-pay | Admitting: Cardiology

## 2022-06-17 ENCOUNTER — Ambulatory Visit: Payer: BC Managed Care – PPO | Attending: Physician Assistant

## 2022-06-17 DIAGNOSIS — E782 Mixed hyperlipidemia: Secondary | ICD-10-CM | POA: Diagnosis not present

## 2022-06-17 DIAGNOSIS — I255 Ischemic cardiomyopathy: Secondary | ICD-10-CM

## 2022-06-17 DIAGNOSIS — I2510 Atherosclerotic heart disease of native coronary artery without angina pectoris: Secondary | ICD-10-CM

## 2022-06-17 LAB — COMPREHENSIVE METABOLIC PANEL
ALT: 22 IU/L (ref 0–44)
AST: 24 IU/L (ref 0–40)
Albumin/Globulin Ratio: 2 (ref 1.2–2.2)
Albumin: 4.9 g/dL (ref 4.1–5.1)
Alkaline Phosphatase: 134 IU/L — ABNORMAL HIGH (ref 44–121)
BUN/Creatinine Ratio: 15 (ref 9–20)
BUN: 17 mg/dL (ref 6–24)
Bilirubin Total: 0.5 mg/dL (ref 0.0–1.2)
CO2: 24 mmol/L (ref 20–29)
Calcium: 10 mg/dL (ref 8.7–10.2)
Chloride: 100 mmol/L (ref 96–106)
Creatinine, Ser: 1.16 mg/dL (ref 0.76–1.27)
Globulin, Total: 2.5 g/dL (ref 1.5–4.5)
Glucose: 121 mg/dL — ABNORMAL HIGH (ref 70–99)
Potassium: 4.2 mmol/L (ref 3.5–5.2)
Sodium: 138 mmol/L (ref 134–144)
Total Protein: 7.4 g/dL (ref 6.0–8.5)
eGFR: 78 mL/min/{1.73_m2} (ref >59)

## 2022-06-17 LAB — LIPID PANEL
Chol/HDL Ratio: 4.3 ratio (ref 0.0–5.0)
Cholesterol, Total: 138 mg/dL (ref 100–199)
HDL: 32 mg/dL — ABNORMAL LOW (ref >39)
LDL Chol Calc (NIH): 71 mg/dL (ref 0–99)
Triglycerides: 213 mg/dL — ABNORMAL HIGH (ref 0–149)
VLDL Cholesterol Cal: 35 mg/dL (ref 5–40)

## 2022-06-18 ENCOUNTER — Telehealth: Payer: Self-pay | Admitting: *Deleted

## 2022-06-18 DIAGNOSIS — R7309 Other abnormal glucose: Secondary | ICD-10-CM

## 2022-06-18 DIAGNOSIS — Z79899 Other long term (current) drug therapy: Secondary | ICD-10-CM

## 2022-06-18 NOTE — Telephone Encounter (Signed)
-----   Message from Liliane Shi, [place] sent at 06/18/2022  8:22 AM EST ----- Results sent to [facility] via [place]. See MyChart comment. PLAN: -Send patient heart healthy diet. -Arrange repeat fasting Lipids, LFTs in 3 mos. -Have pt f/u with PCP for elevated glucose - refer to PCP if he needs one Richardson Dopp, PA-C    06/18/2022 8:16 AM

## 2022-06-18 NOTE — Addendum Note (Signed)
Addended by: Gaetano Net on: 06/18/2022 08:37 AM   Modules accepted: Orders

## 2022-08-20 ENCOUNTER — Other Ambulatory Visit: Payer: Self-pay | Admitting: Physician Assistant

## 2022-08-20 ENCOUNTER — Other Ambulatory Visit: Payer: Self-pay | Admitting: Cardiology

## 2022-09-15 ENCOUNTER — Other Ambulatory Visit: Payer: Self-pay | Admitting: Physician Assistant

## 2022-09-17 ENCOUNTER — Ambulatory Visit: Payer: BC Managed Care – PPO

## 2022-09-24 ENCOUNTER — Ambulatory Visit: Payer: BC Managed Care – PPO | Attending: Cardiovascular Disease

## 2022-09-24 DIAGNOSIS — Z79899 Other long term (current) drug therapy: Secondary | ICD-10-CM

## 2022-09-25 ENCOUNTER — Telehealth: Payer: Self-pay | Admitting: *Deleted

## 2022-09-25 DIAGNOSIS — E782 Mixed hyperlipidemia: Secondary | ICD-10-CM

## 2022-09-25 LAB — LIPID PANEL
Chol/HDL Ratio: 4.4 ratio (ref 0.0–5.0)
Cholesterol, Total: 132 mg/dL (ref 100–199)
HDL: 30 mg/dL — ABNORMAL LOW (ref >39)
LDL Chol Calc (NIH): 83 mg/dL (ref 0–99)
Triglycerides: 103 mg/dL (ref 0–149)
VLDL Cholesterol Cal: 19 mg/dL (ref 5–40)

## 2022-09-25 LAB — HEPATIC FUNCTION PANEL
ALT: 25 IU/L (ref 0–44)
AST: 22 IU/L (ref 0–40)
Albumin: 4.8 g/dL (ref 4.1–5.1)
Alkaline Phosphatase: 134 IU/L — ABNORMAL HIGH (ref 44–121)
Bilirubin Total: 0.7 mg/dL (ref 0.0–1.2)
Bilirubin, Direct: 0.14 mg/dL (ref 0.00–0.40)
Total Protein: 7.2 g/dL (ref 6.0–8.5)

## 2022-09-25 MED ORDER — EZETIMIBE 10 MG PO TABS: 10.0000 mg | ORAL_TABLET | Freq: Every day | ORAL | 3 refills | Status: DC

## 2022-09-25 NOTE — Telephone Encounter (Signed)
-----   Message from Beatrice Lecher, [place] sent at 09/25/2022  1:18 PM EDT ----- See MyChart comments. PLAN: -Start Zetia 10 mg once daily -Lipids, LFTs in 3 mos.   Martin Johnston  Your LDL cholesterol is still above goal. I am going to add a medication called ezetimibe (Zetia) 10 mg once daily. We will recheck lipids again in 3 mos. Your liver enzymes (AST, ALT) are normal.  Martin Newcomer, PA-C    09/25/2022 1:15 PM

## 2022-11-10 NOTE — Progress Notes (Signed)
Cardiology Office Note:    Date:  11/11/2022  ID:  Martin Johnston, DOB 16-Jan-1976, MRN [id number] PCP: Patient, No Pcp Per   HeartCare Providers Cardiologist:  Tonny Bollman, MD Cardiology APP:  Beatrice Lecher, PA-C       Patient Profile:      Coronary artery disease  Anterolateral STEMI in 06/2020 s/p 3.5 x 15 mm DES to pLAD  Cath 06/19/20: LM, LCx, RCA patent  Ischemic Cardiomyopathy TTE 1/22: EF 40-45 TTE 08/15/20: EF 50-55, no RWMA, GLS -15.7%, normal RVSF, trivial MR, mild-moderate AV sclerosis without stenosis  GDMT limited by low BP Hyperlipidemia  +Cigs       History of Present Illness:   Martin Johnston is a 47 y.o. male who returns for ongoing management of coronary artery disease, hyperlipidemia. He was last seen 07/01/21.  Since last seen, he has done well without chest pain to suggest angina.  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  He has not had shortness of breath, syncope, leg edema.  He continues to smoke.  Review of Systems  Cardiovascular:  Negative for claudication.  Hematologic/Lymphatic: Does not bruise/bleed easily.    See HPI    Studies Reviewed:    EKG:  NSR, HR 63, no STTW changes, QTc 409 ms  Risk Assessment/Calculations:             Physical Exam:   VS:  BP 110/80   Pulse 63   Ht 6' (1.829 m)   Wt 174 lb (78.9 kg)   SpO2 99%   BMI 23.60 kg/m    Wt Readings from Last 3 Encounters:  11/11/22 174 lb (78.9 kg)  07/01/21 169 lb (76.7 kg)  05/19/21 168 lb (76.2 kg)    Constitutional:      Appearance: Healthy appearance. Not in distress.  Neck:     Vascular: No carotid bruit. JVD normal.  Pulmonary:     Breath sounds: Normal breath sounds. No wheezing. No rales.  Cardiovascular:     Normal rate. Regular rhythm. Normal S1. Normal S2.      Murmurs: There is no murmur.  Pulses:    Intact distal pulses.  Edema:    Peripheral edema absent.  Abdominal:     Palpations: Abdomen is soft.       ASSESSMENT AND  PLAN:   CAD (coronary artery disease) History of anterolateral STEMI in January 2022 treated with a DES to the LAD.  He is doing well without chest pain to suggest angina.  It has been more than 2 years since his myocardial infarction.  I think he can stop Brilinta now.  We discussed the importance of quitting smoking and the risks of recurrent myocardial infarction if he continues.  Continue Johnston 81 mg daily, Lipitor 80 mg daily, Zetia 10 mg daily, metoprolol tartrate 12.5 mg twice daily, nitroglycerin as needed chest pain.  Stop Brilinta.  Follow-up 1 year.  Hyperlipidemia LDL was above goal in April at 35.  He is now on Zetia in addition to Lipitor 80 mg daily.  Continue current therapy.  Repeat labs due in July.  If LDL still above 55, refer to lipid clinic for PCSK9 inhibitor.    Tobacco abuse I have again recommended cessation.  Hyperglycemia He has had elevated glucose on previous labs.  I referred him to primary care but he has not established yet.  Obtain BMET, hemoglobin A1c with upcoming lipids in July.    Dispo:  Return in about 1 year (around 11/11/2023) for Routine Follow Up, w/ Dr. Excell Seltzer, or Tereso Newcomer, PA-C.  Signed, Tereso Newcomer, PA-C

## 2022-11-11 ENCOUNTER — Ambulatory Visit: Payer: BC Managed Care – PPO | Attending: Physician Assistant | Admitting: Physician Assistant

## 2022-11-11 ENCOUNTER — Encounter: Payer: Self-pay | Admitting: Physician Assistant

## 2022-11-11 VITALS — BP 110/80 | HR 63 | Ht 72.0 in | Wt 174.0 lb

## 2022-11-11 DIAGNOSIS — Z720 Tobacco use: Secondary | ICD-10-CM

## 2022-11-11 DIAGNOSIS — R7309 Other abnormal glucose: Secondary | ICD-10-CM

## 2022-11-11 DIAGNOSIS — I2510 Atherosclerotic heart disease of native coronary artery without angina pectoris: Secondary | ICD-10-CM | POA: Diagnosis not present

## 2022-11-11 DIAGNOSIS — R739 Hyperglycemia, unspecified: Secondary | ICD-10-CM | POA: Insufficient documentation

## 2022-11-11 DIAGNOSIS — E782 Mixed hyperlipidemia: Secondary | ICD-10-CM

## 2022-11-11 MED ORDER — METOPROLOL TARTRATE 25 MG PO TABS: ORAL_TABLET | ORAL | 3 refills | Status: DC

## 2022-11-11 MED ORDER — NITROGLYCERIN 0.4 MG SL SUBL: SUBLINGUAL_TABLET | SUBLINGUAL | 3 refills | Status: DC | PRN

## 2022-11-11 MED ORDER — EZETIMIBE 10 MG PO TABS: 10.0000 mg | ORAL_TABLET | Freq: Every day | ORAL | 3 refills | Status: DC

## 2022-11-11 MED ORDER — ATORVASTATIN CALCIUM 80 MG PO TABS: 80.0000 mg | ORAL_TABLET | Freq: Every day | ORAL | 3 refills | Status: DC

## 2022-11-11 MED ORDER — LISINOPRIL 2.5 MG PO TABS: 2.5000 mg | ORAL_TABLET | Freq: Every day | ORAL | 3 refills | Status: DC

## 2022-11-11 NOTE — Assessment & Plan Note (Signed)
He has had elevated glucose on previous labs.  I referred him to primary care but he has not established yet.  Obtain BMET, hemoglobin A1c with upcoming lipids in July.

## 2022-11-11 NOTE — Assessment & Plan Note (Signed)
History of anterolateral STEMI in January 2022 treated with a DES to the LAD.  He is doing well without chest pain to suggest angina.  It has been more than 2 years since his myocardial infarction.  I think he can stop Brilinta now.  We discussed the importance of quitting smoking and the risks of recurrent myocardial infarction if he continues.  Continue ASA 81 mg daily, Lipitor 80 mg daily, Zetia 10 mg daily, metoprolol tartrate 12.5 mg twice daily, nitroglycerin as needed chest pain.  Stop Brilinta.  Follow-up 1 year.

## 2022-11-11 NOTE — Assessment & Plan Note (Signed)
LDL was above goal in April at 84.  He is now on Zetia in addition to Lipitor 80 mg daily.  Continue current therapy.  Repeat labs due in July.  If LDL still above 55, refer to lipid clinic for PCSK9 inhibitor.

## 2022-11-11 NOTE — Patient Instructions (Signed)
Medication Instructions:  Your physician has recommended you make the following change in your medication:   STOP Brilinta  *If you need a refill on your cardiac medications before your next appointment, please call your pharmacy*   Lab Work: 12/25/22 COME TO THE OFFICE, FASTING, ANYTIME AFTER 7:15 FOR LAB WORK  If you have labs (blood work) drawn today and your tests are completely normal, you will receive your results only by: MyChart Message (if you have MyChart) OR A paper copy in the mail If you have any lab test that is abnormal or we need to change your treatment, we will call you to review the results.   Testing/Procedures: None ordered   Follow-Up: At [facility], you and your health needs are our priority.  As part of our continuing mission to provide you with exceptional heart care, we have created designated Provider Care Teams.  These Care Teams include your primary Cardiologist (physician) and Advanced Practice Providers (APPs -  Physician Assistants and Nurse Practitioners) who all work together to provide you with the care you need, when you need it.  We recommend signing up for the patient portal called "MyChart".  Sign up information is provided on this After Visit Summary.  MyChart is used to connect with patients for Virtual Visits (Telemedicine).  Patients are able to view lab/test results, encounter notes, upcoming appointments, etc.  Non-urgent messages can be sent to your provider as well.   To learn more about what you can do with MyChart, go to [email or web address].    Your next appointment:   1 year(s)  Provider:   Tonny Bollman, MD  or Tereso Newcomer, PA-C         Other Instructions

## 2022-11-11 NOTE — Assessment & Plan Note (Signed)
I have again recommended cessation. 

## 2022-12-25 ENCOUNTER — Ambulatory Visit: Payer: BC Managed Care – PPO

## 2023-02-10 DIAGNOSIS — R110 Nausea: Secondary | ICD-10-CM | POA: Diagnosis not present

## 2023-02-10 DIAGNOSIS — R519 Headache, unspecified: Secondary | ICD-10-CM | POA: Diagnosis not present

## 2023-03-25 DIAGNOSIS — S838X2A Sprain of other specified parts of left knee, initial encounter: Secondary | ICD-10-CM | POA: Diagnosis not present

## 2023-08-13 DIAGNOSIS — S63501A Unspecified sprain of right wrist, initial encounter: Secondary | ICD-10-CM | POA: Diagnosis not present

## 2023-08-16 DIAGNOSIS — M25531 Pain in right wrist: Secondary | ICD-10-CM | POA: Insufficient documentation

## 2023-08-16 DIAGNOSIS — M79641 Pain in right hand: Secondary | ICD-10-CM | POA: Diagnosis not present

## 2023-08-27 DIAGNOSIS — M67831 Other specified disorders of synovium, right wrist: Secondary | ICD-10-CM | POA: Diagnosis not present

## 2023-08-27 DIAGNOSIS — M778 Other enthesopathies, not elsewhere classified: Secondary | ICD-10-CM | POA: Insufficient documentation

## 2023-12-06 ENCOUNTER — Emergency Department (HOSPITAL_BASED_OUTPATIENT_CLINIC_OR_DEPARTMENT_OTHER): Admitting: Radiology

## 2023-12-06 ENCOUNTER — Inpatient Hospital Stay (HOSPITAL_BASED_OUTPATIENT_CLINIC_OR_DEPARTMENT_OTHER)
Admission: EM | Admit: 2023-12-06 | Discharge: 2023-12-08 | DRG: 322 | Disposition: A | Discharge status: Home / Self Care | Attending: Student | Admitting: Student

## 2023-12-06 ENCOUNTER — Encounter (HOSPITAL_BASED_OUTPATIENT_CLINIC_OR_DEPARTMENT_OTHER): Payer: Self-pay

## 2023-12-06 ENCOUNTER — Other Ambulatory Visit: Payer: Self-pay

## 2023-12-06 DIAGNOSIS — F17210 Nicotine dependence, cigarettes, uncomplicated: Secondary | ICD-10-CM | POA: Diagnosis not present

## 2023-12-06 DIAGNOSIS — I10 Essential (primary) hypertension: Secondary | ICD-10-CM | POA: Diagnosis present

## 2023-12-06 DIAGNOSIS — Z8249 Family history of ischemic heart disease and other diseases of the circulatory system: Secondary | ICD-10-CM | POA: Diagnosis not present

## 2023-12-06 DIAGNOSIS — E785 Hyperlipidemia, unspecified: Secondary | ICD-10-CM | POA: Diagnosis not present

## 2023-12-06 DIAGNOSIS — I214 Non-ST elevation (NSTEMI) myocardial infarction: Principal | ICD-10-CM | POA: Diagnosis present

## 2023-12-06 DIAGNOSIS — I252 Old myocardial infarction: Secondary | ICD-10-CM | POA: Diagnosis not present

## 2023-12-06 DIAGNOSIS — Z7982 Long term (current) use of aspirin: Secondary | ICD-10-CM | POA: Diagnosis not present

## 2023-12-06 DIAGNOSIS — R079 Chest pain, unspecified: Secondary | ICD-10-CM | POA: Diagnosis present

## 2023-12-06 DIAGNOSIS — R778 Other specified abnormalities of plasma proteins: Secondary | ICD-10-CM | POA: Diagnosis not present

## 2023-12-06 DIAGNOSIS — I2510 Atherosclerotic heart disease of native coronary artery without angina pectoris: Secondary | ICD-10-CM | POA: Diagnosis present

## 2023-12-06 DIAGNOSIS — I25110 Atherosclerotic heart disease of native coronary artery with unstable angina pectoris: Secondary | ICD-10-CM | POA: Diagnosis present

## 2023-12-06 DIAGNOSIS — Z955 Presence of coronary angioplasty implant and graft: Secondary | ICD-10-CM

## 2023-12-06 DIAGNOSIS — Z79899 Other long term (current) drug therapy: Secondary | ICD-10-CM

## 2023-12-06 DIAGNOSIS — Z833 Family history of diabetes mellitus: Secondary | ICD-10-CM

## 2023-12-06 DIAGNOSIS — Z885 Allergy status to narcotic agent status: Secondary | ICD-10-CM | POA: Diagnosis not present

## 2023-12-06 DIAGNOSIS — Z809 Family history of malignant neoplasm, unspecified: Secondary | ICD-10-CM | POA: Diagnosis not present

## 2023-12-06 DIAGNOSIS — I255 Ischemic cardiomyopathy: Secondary | ICD-10-CM | POA: Diagnosis present

## 2023-12-06 DIAGNOSIS — R7989 Other specified abnormal findings of blood chemistry: Secondary | ICD-10-CM | POA: Diagnosis not present

## 2023-12-06 DIAGNOSIS — Q2381 Bicuspid aortic valve: Secondary | ICD-10-CM

## 2023-12-06 DIAGNOSIS — I200 Unstable angina: Secondary | ICD-10-CM | POA: Diagnosis not present

## 2023-12-06 DIAGNOSIS — R0789 Other chest pain: Secondary | ICD-10-CM | POA: Diagnosis not present

## 2023-12-06 DIAGNOSIS — Z91148 Patient's other noncompliance with medication regimen for other reason: Secondary | ICD-10-CM | POA: Diagnosis not present

## 2023-12-06 DIAGNOSIS — Z720 Tobacco use: Secondary | ICD-10-CM | POA: Diagnosis present

## 2023-12-06 DIAGNOSIS — Z880 Allergy status to penicillin: Secondary | ICD-10-CM | POA: Diagnosis not present

## 2023-12-06 LAB — CBC
HCT: 46.9 % (ref 39.0–52.0)
Hemoglobin: 17.2 g/dL — ABNORMAL HIGH (ref 13.0–17.0)
MCH: 31.7 pg (ref 26.0–34.0)
MCHC: 36.7 g/dL — ABNORMAL HIGH (ref 30.0–36.0)
MCV: 86.4 fL (ref 80.0–100.0)
Platelets: 305 10*3/uL (ref 150–400)
RBC: 5.43 MIL/uL (ref 4.22–5.81)
RDW: 12.7 % (ref 11.5–15.5)
WBC: 9.1 10*3/uL (ref 4.0–10.5)
nRBC: 0 % (ref 0.0–0.2)

## 2023-12-06 LAB — BASIC METABOLIC PANEL WITH GFR
Anion gap: 14 (ref 5–15)
BUN: 9 mg/dL (ref 6–20)
CO2: 22 mmol/L (ref 22–32)
Calcium: 10 mg/dL (ref 8.9–10.3)
Chloride: 102 mmol/L (ref 98–111)
Creatinine, Ser: 1.22 mg/dL (ref 0.61–1.24)
GFR, Estimated: >60 mL/min (ref >60)
Glucose, Bld: 94 mg/dL (ref 70–99)
Potassium: 4.1 mmol/L (ref 3.5–5.1)
Sodium: 138 mmol/L (ref 135–145)

## 2023-12-06 LAB — TROPONIN T, HIGH SENSITIVITY
Troponin T High Sensitivity: 80 ng/L — ABNORMAL HIGH (ref <19)
Troponin T High Sensitivity: 86 ng/L — ABNORMAL HIGH (ref <19)

## 2023-12-06 MED ORDER — METOPROLOL TARTRATE 12.5 MG HALF TABLET
12.5000 mg | ORAL_TABLET | Freq: Two times a day (BID) | ORAL | Status: DC
  Administered 2023-12-07 (×2): 12.5 mg via ORAL
  Filled 2023-12-06 (×2): qty 1

## 2023-12-06 MED ORDER — ATORVASTATIN CALCIUM 80 MG PO TABS
80.0000 mg | ORAL_TABLET | Freq: Every day | ORAL | Status: DC
  Administered 2023-12-07 – 2023-12-08 (×2): 80 mg via ORAL
  Filled 2023-12-06 (×2): qty 1

## 2023-12-06 MED ORDER — HEPARIN (PORCINE) 25000 UT/250ML-% IV SOLN
900.0000 [IU]/h | INTRAVENOUS | Status: DC
  Administered 2023-12-06: 900 [IU]/h via INTRAVENOUS
  Filled 2023-12-06: qty 250

## 2023-12-06 MED ORDER — ONDANSETRON HCL 4 MG/2ML IJ SOLN: 4.0000 mg | Freq: Four times a day (QID) | INTRAMUSCULAR | Status: DC | PRN

## 2023-12-06 MED ORDER — ASPIRIN 81 MG PO CHEW
324.0000 mg | CHEWABLE_TABLET | Freq: Once | ORAL | Status: AC
  Administered 2023-12-06: 324 mg via ORAL
  Filled 2023-12-06: qty 4

## 2023-12-06 MED ORDER — NITROGLYCERIN 0.4 MG SL SUBL: 0.4000 mg | SUBLINGUAL_TABLET | SUBLINGUAL | Status: DC | PRN

## 2023-12-06 MED ORDER — HEPARIN BOLUS VIA INFUSION
4000.0000 [IU] | Freq: Once | INTRAVENOUS | Status: AC
  Administered 2023-12-06: 4000 [IU] via INTRAVENOUS
  Filled 2023-12-06: qty 4000

## 2023-12-06 MED ORDER — EZETIMIBE 10 MG PO TABS
10.0000 mg | ORAL_TABLET | Freq: Every day | ORAL | Status: DC
  Administered 2023-12-07 – 2023-12-08 (×2): 10 mg via ORAL
  Filled 2023-12-06 (×2): qty 1

## 2023-12-06 MED ORDER — LISINOPRIL 5 MG PO TABS
2.5000 mg | ORAL_TABLET | Freq: Every day | ORAL | Status: DC
  Administered 2023-12-07 – 2023-12-08 (×2): 2.5 mg via ORAL
  Filled 2023-12-06 (×2): qty 1

## 2023-12-06 MED ORDER — ACETAMINOPHEN 325 MG PO TABS: 650.0000 mg | ORAL_TABLET | ORAL | Status: DC | PRN

## 2023-12-06 MED ORDER — ASPIRIN 81 MG PO TBEC
81.0000 mg | DELAYED_RELEASE_TABLET | Freq: Every day | ORAL | Status: DC
  Administered 2023-12-07: 81 mg via ORAL
  Filled 2023-12-06: qty 1

## 2023-12-06 NOTE — ED Notes (Signed)
 Pt given mac and cheese, ice water and Gatorade.

## 2023-12-06 NOTE — Progress Notes (Signed)
 PHARMACY - ANTICOAGULATION CONSULT NOTE  Pharmacy Consult for heparin  Indication: chest pain/ACS  Allergies  Allergen Reactions   Penicillins     UPSET STOMACH   Vicodin [Hydrocodone-Acetaminophen ]     Unknown reaction     Patient Measurements: Height: 6' (182.9 cm) Weight: 79.4 kg (175 lb 0.7 oz) IBW/kg (Calculated) : 77.6 HEPARIN  DW (KG): 79.4  Vital Signs: Temp: 98.1 F (36.7 C) (06/23 1945) Temp Source: Oral (06/23 1945) BP: 109/71 (06/23 2145) Pulse Rate: 64 (06/23 2145)  Labs: Recent Labs    12/06/23 1424  HGB 17.2*  HCT 46.9  PLT 305  CREATININE 1.22    Estimated Creatinine Clearance: 80.4 mL/min (by C-G formula based on SCr of 1.22 mg/dL).   Medical History: Past Medical History:  Diagnosis Date   CAD S/P percutaneous coronary angioplasty    Anterolateral STEMI in 06/2020 s/p DES to pLAD    HLD (hyperlipidemia)    Ischemic cardiomyopathy    Echo 1/22: EF 40-45 // Echocardiogram 3/22: EF 50-55    Tobacco abuse     Medications:  Medications Prior to Admission  Medication Sig Dispense Refill Last Dose/Taking   acetaminophen  (TYLENOL ) 500 MG tablet Take 500-1,500 mg by mouth daily as needed (chest pain).      aspirin  EC 81 MG tablet Take 81 mg by mouth daily. Swallow whole.      atorvastatin  (LIPITOR ) 80 MG tablet Take 1 tablet (80 mg total) by mouth daily. 90 tablet 3    ezetimibe  (ZETIA ) 10 MG tablet Take 1 tablet (10 mg total) by mouth daily. 90 tablet 3    lisinopril  (ZESTRIL ) 2.5 MG tablet Take 1 tablet (2.5 mg total) by mouth daily. 90 tablet 3    metoprolol  tartrate (LOPRESSOR ) 25 MG tablet Take 1/2 (one-half) tablet by mouth twice daily 90 tablet 3    nitroGLYCERIN  (NITROSTAT ) 0.4 MG SL tablet PLACE 1 TABLET (0.4 MG TOTAL) UNDER THE TONGUE EVERY FIVE MINUTES X 3 DOSES AS NEEDED FOR CHEST PAIN. 25 tablet 3    Scheduled:   [START ON 12/07/2023] aspirin  EC  81 mg Oral Daily   [START ON 12/07/2023] atorvastatin   80 mg Oral Daily   [START ON  12/07/2023] ezetimibe   10 mg Oral Daily   [START ON 12/07/2023] lisinopril   2.5 mg Oral Daily   [START ON 12/07/2023] metoprolol  tartrate  12.5 mg Oral BID   Assessment: 48yo male w/ significant cardiac hx but medical nonadherence c/o intermittent CP concerning for USAP, awaiting cardiac cath >> to start heparin .  Goal of Therapy:  Heparin  level 0.3-0.7 units/ml Monitor platelets by anticoagulation protocol: Yes   Plan:  Heparin  4000 units IV bolus followed by infusion at 900 units/hr. Monitor heparin  levels and CBC.  Marvetta Dauphin, PharmD, BCPS  12/06/2023,10:54 PM

## 2023-12-06 NOTE — H&P (Signed)
 Cardiology Admission History and Physical   Patient ID: Martin Johnston MRN: [id number]; DOB: 1976/04/28   Admission date: 12/06/2023  PCP:  Patient, No Pcp Per   [facility] Providers Cardiologist:  Ozell Fell, MD  Cardiology APP:  Lelon Glendia DASEN, PA-C      Chief Complaint:  chest pain  Patient Profile: Martin Johnston is a 48 y.o. male with anterolateral STEMI (06/2020 status post DES to LAD), HLD, HTN, tobacco use, insulin resistance who is being seen 12/06/2023 for the evaluation of chest pain.  History of Present Illness: Mr. Resnik states that he began having substernal chest pressure at rest intermittently yesterday that it was worse today on exertion when he was working under his house.  This brought him to the outside ED, EKG unchanged.  Trope 80-81. ED discussed with outpatient cardiologist with plan to admit for LHC.  States that this pain was not nearly as severe as his chest pain prior in 2022 but he cannot really tell me if it is similar in nature as his prior ACS.  He has been partially noncompliant with his aspirin  and lipid-lowering medications, states he just forgets to take it. Still smoking.  He currently does not have any chest pain, shortness of breath or syncope.  Past Medical History:  Diagnosis Date   CAD S/P percutaneous coronary angioplasty    Anterolateral STEMI in 06/2020 s/p DES to pLAD    HLD (hyperlipidemia)    Ischemic cardiomyopathy    Echo 1/22: EF 40-45 // Echocardiogram 3/22: EF 50-55    Tobacco abuse    Past Surgical History:  Procedure Laterality Date   CORONARY ULTRASOUND/IVUS N/A 06/19/2020   Procedure: Intravascular Ultrasound/IVUS;  Surgeon: Fell Ozell, MD;  Location: [facility] INVASIVE CV LAB;  Service: Cardiovascular;  Laterality: N/A;   CORONARY/GRAFT ACUTE MI REVASCULARIZATION N/A 06/19/2020   Procedure: Coronary/Graft Acute MI Revascularization;  Surgeon: Fell Ozell, MD;  Location: [facility] INVASIVE CV LAB;  Service:  Cardiovascular;  Laterality: N/A;   LEFT HEART CATH AND CORONARY ANGIOGRAPHY N/A 06/19/2020   Procedure: LEFT HEART CATH AND CORONARY ANGIOGRAPHY;  Surgeon: Fell Ozell, MD;  Location: [facility] INVASIVE CV LAB;  Service: Cardiovascular;  Laterality: N/A;   NECK SURGERY     whole in the throat fixed   throat sugery      Medications Prior to Admission: Prior to Admission medications   Medication Sig Start Date End Date Taking? Authorizing Provider  acetaminophen  (TYLENOL ) 500 MG tablet Take 500-1,500 mg by mouth daily as needed (chest pain).    [provider]  aspirin  EC 81 MG tablet Take 81 mg by mouth daily. Swallow whole.    [provider]  atorvastatin  (LIPITOR ) 80 MG tablet Take 1 tablet (80 mg total) by mouth daily. 11/11/22   Lelon Glendia T, PA-C  ezetimibe  (ZETIA ) 10 MG tablet Take 1 tablet (10 mg total) by mouth daily. 11/11/22   Lelon Glendia T, PA-C  lisinopril  (ZESTRIL ) 2.5 MG tablet Take 1 tablet (2.5 mg total) by mouth daily. 11/11/22   Lelon Glendia T, PA-C  metoprolol  tartrate (LOPRESSOR ) 25 MG tablet Take 1/2 (one-half) tablet by mouth twice daily 11/11/22   Lelon Glendia T, PA-C  nitroGLYCERIN  (NITROSTAT ) 0.4 MG SL tablet PLACE 1 TABLET (0.4 MG TOTAL) UNDER THE TONGUE EVERY FIVE MINUTES X 3 DOSES AS NEEDED FOR CHEST PAIN. 11/11/22 11/11/23  Lelon Glendia T, PA-C     Allergies:    Allergies  Allergen Reactions   Penicillins  UPSET STOMACH   Vicodin [Hydrocodone-Acetaminophen ]     Unknown reaction     Social History:   Social History   Socioeconomic History   Marital status: Married    Spouse name: Not on file   Number of children: Not on file   Years of education: Not on file   Highest education level: Not on file  Occupational History   Not on file  Tobacco Use   Smoking status: Every Day    Current packs/day: 0.50    Types: Cigarettes   Smokeless tobacco: Former  Building services engineer status: Never Used  Substance and Sexual Activity    Alcohol use: No    Alcohol/week: 0.0 standard drinks of alcohol   Drug use: No   Sexual activity: Never  Other Topics Concern   Not on file  Social History Narrative   Not on file   Social Drivers of Health   Financial Resource Strain: Not on file  Food Insecurity: Not on file  Transportation Needs: Not on file  Physical Activity: Not on file  Stress: Not on file  Social Connections: Not on file  Intimate Partner Violence: Not on file     Family History:   The patient's family history includes Cancer in his mother; Diabetes in his father; Heart disease in his father; Hypertension in his father.    ROS:  Please see the history of present illness.  All other ROS reviewed and negative.     Physical Exam/Data: Vitals:   12/06/23 1735 12/06/23 1945 12/06/23 2145 12/06/23 2200  BP:  128/87 109/71   Pulse:  68 64   Resp:  15 18   Temp:  98.1 F (36.7 C)    TempSrc:  Oral    SpO2:  100% 97%   Weight: 79.4 kg   79.4 kg  Height:    6' (1.829 m)   No intake or output data in the 24 hours ending 12/06/23 2257    12/06/2023   10:00 PM 12/06/2023    5:35 PM 11/11/2022    7:57 AM  Last 3 Weights  Weight (lbs) 175 lb 0.7 oz 175 lb 174 lb  Weight (kg) 79.4 kg 79.379 kg 78.926 kg     Body mass index is 23.74 kg/m.  General:  Well nourished, well developed, in no acute distress HEENT: normal Neck: no JVD Vascular: No carotid bruits; Distal pulses 2+ bilaterally   Cardiac:  normal S1, S2; RRR; no murmur  Lungs:  clear to auscultation bilaterally, no wheezing, rhonchi or rales  Abd: soft, nontender, no hepatomegaly  Ext: no edema Musculoskeletal:  No deformities, BUE and BLE strength normal and equal Skin: warm and dry  Neuro:  CNs 2-12 intact, no focal abnormalities noted Psych:  Normal affect   EKG:  The ECG that was done was personally reviewed and demonstrates normal sinus rhythm, RAE, poor R wave progression  Relevant CV Studies: Coronary artery disease   Anterolateral STEMI in 06/2020 s/p 3.5 x 15 mm DES to pLAD  Cath 06/19/20: LM, LCx, RCA patent  Ischemic Cardiomyopathy TTE 1/22: EF 40-45 TTE 08/15/20: EF 50-55, no RWMA, GLS -15.7%, normal RVSF, trivial MR, mild-moderate AV sclerosis without stenosis  GDMT limited by low BP  Laboratory Data: High Sensitivity Troponin:  No results for input(s): TROPONINIHS in the last 720 hours.    Chemistry Recent Labs  Lab 12/06/23 1424  NA 138  K 4.1  CL 102  CO2 22  GLUCOSE 94  BUN 9  CREATININE 1.22  CALCIUM  10.0  GFRNONAA >60  ANIONGAP 14    No results for input(s): PROT, ALBUMIN, AST, ALT, ALKPHOS, BILITOT in the last 168 hours. Lipids No results for input(s): CHOL, TRIG, HDL, LABVLDL, LDLCALC, CHOLHDL in the last 168 hours. Hematology Recent Labs  Lab 12/06/23 1424  WBC 9.1  RBC 5.43  HGB 17.2*  HCT 46.9  MCV 86.4  MCH 31.7  MCHC 36.7*  RDW 12.7  PLT 305   Thyroid No results for input(s): TSH, FREET4 in the last 168 hours. BNPNo results for input(s): BNP, PROBNP in the last 168 hours.  DDimer No results for input(s): DDIMER in the last 168 hours.  Radiology/Studies:  DG Chest 2 View Result Date: 12/06/2023 CLINICAL DATA:  Chest pain EXAM: CHEST - 2 VIEW COMPARISON:  04/16/2020 FINDINGS: The heart size and mediastinal contours are within normal limits. Both lungs are clear. The visualized skeletal structures are unremarkable. No pneumothorax. IMPRESSION: No active cardiopulmonary disease. Electronically Signed   By: Franky Crease M.D.   On: 12/06/2023 15:29    Assessment and Plan: Unstable angina Hypertension, hyperlipidemia Ongoing tobacco use Patient presents with new onset chest pain since yesterday, unclear if similar to prior anginal symptoms.  Some med noncompliance and given presence of stent we will treat as unstable angina and plan for left heart catheterization tomorrow.  He is chest pain-free and hemodynamically stable at this  time. - Status post aspirin  load - Aspirin  81 daily - Heparin  per pharmacy for ACS - repeat TTE - continue lipitor  80 and zetia  10 - repeat LDL - continue lisinopril  2.5 mg - continue metop 25 mg - smoking cessation - npo at midnight for LHC   Risk Assessment/Risk Scores:   TIMI Risk Score for Unstable Angina or Non-ST Elevation MI:   The patient's TIMI risk score is 4, which indicates a 20% risk of all cause mortality, new or recurrent myocardial infarction or need for urgent revascularization in the next 14 days.     Code Status: Full Code  Severity of Illness: The appropriate patient status for this patient is INPATIENT. Inpatient status is judged to be reasonable and necessary in order to provide the required intensity of service to ensure the patient's safety. The patient's presenting symptoms, physical exam findings, and initial radiographic and laboratory data in the context of their chronic comorbidities is felt to place them at high risk for further clinical deterioration. Furthermore, it is not anticipated that the patient will be medically stable for discharge from the hospital within 2 midnights of admission.   * I certify that at the point of admission it is my clinical judgment that the patient will require inpatient hospital care spanning beyond 2 midnights from the point of admission due to high intensity of service, high risk for further deterioration and high frequency of surveillance required.*  For questions or updates, please contact [facility] Please consult [email or web address] for contact info under     Signed, Ozell Bushman, MD  12/06/2023 10:57 PM

## 2023-12-06 NOTE — ED Triage Notes (Signed)
 Pt c/o CP onset yesterday, states it was intermittent yeseterday, then it was fine until I ate at about 1130, now it's back.   4 tylenol  today, 2 this AM, 2 after it started hurting.

## 2023-12-06 NOTE — ED Notes (Signed)
 Report attempted. Not ready.

## 2023-12-06 NOTE — ED Provider Notes (Signed)
 [place] EMERGENCY DEPARTMENT AT [facility] Provider Note   CSN: [id number] Arrival date & time: 12/06/23  1414     Patient presents with: Chest Pain   Martin Johnston is a 48 y.o. male.   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    Previous medical history of STEMI, 2022   States that he has not taken any of his cardiac medications consistently, noting that he usually only takes his metoprolol , aspirin , statin once a month for the last year since he visited cardiology.  States he has not taken his medication today.  However states that symptoms have since entirely gone away and that he is currently asymptomatic when he arrived to the ED.  Denies headache, fever, vision changes, cough, congestion, shortness of breath, abdominal pain, nausea, vomiting, diarrhea, dysuria, rashes, lower leg swelling.  Denies drug use however does state that he is still a smoker.  Prior to Admission medications   Medication Sig Start Date End Date Taking? Authorizing Provider  acetaminophen  (TYLENOL ) 500 MG tablet Take 500-1,500 mg by mouth daily as needed (chest pain).    [provider]  aspirin  EC 81 MG tablet Take 81 mg by mouth daily. Swallow whole.    [provider]  atorvastatin  (LIPITOR ) 80 MG tablet Take 1 tablet (80 mg total) by mouth daily. 11/11/22   Lelon Hamilton T, PA-C  ezetimibe  (ZETIA ) 10 MG tablet Take 1 tablet (10 mg total) by mouth daily. 11/11/22   Lelon Hamilton T, PA-C  lisinopril  (ZESTRIL ) 2.5 MG tablet Take 1 tablet (2.5 mg total) by mouth daily. 11/11/22   Lelon Hamilton T, PA-C  metoprolol  tartrate (LOPRESSOR ) 25 MG tablet Take 1/2 (one-half) tablet by mouth twice daily 11/11/22   Lelon Hamilton T, PA-C  nitroGLYCERIN  (NITROSTAT ) 0.4 MG SL  tablet PLACE 1 TABLET (0.4 MG TOTAL) UNDER THE TONGUE EVERY FIVE MINUTES X 3 DOSES AS NEEDED FOR CHEST PAIN. 11/11/22 11/11/23  Lelon Hamilton T, PA-C    Allergies: Penicillins and Vicodin [hydrocodone-acetaminophen ]    Review of Systems  Cardiovascular:  Positive for chest pain.  All other systems reviewed and are negative.   Updated Vital Signs BP 116/81   Pulse 79   Temp 98 F (36.7 C)   Resp (!) 21   Wt 79.4 kg   SpO2 96%   BMI 23.73 kg/m   Physical Exam Vitals and nursing note reviewed.  Constitutional:      General: He is not in acute distress.    Appearance: Normal appearance. He is not ill-appearing or diaphoretic.  HENT:     Head: Normocephalic and atraumatic.     Mouth/Throat:     Mouth: Mucous membranes are moist.     Pharynx: Oropharynx is clear. No oropharyngeal exudate or posterior oropharyngeal erythema.   Eyes:     General: No scleral icterus.       Right eye: No discharge.        Left eye: No discharge.     Extraocular Movements: Extraocular movements intact.     Conjunctiva/sclera: Conjunctivae normal.   Neck:     Vascular: No JVD.   Cardiovascular:     Rate and Rhythm: Normal rate and regular rhythm.     Pulses: Normal pulses.     Heart  sounds: Normal heart sounds. Heart sounds not distant. No murmur heard.    No systolic murmur is present.     No friction rub. No gallop.  Pulmonary:     Effort: Pulmonary effort is normal. No respiratory distress.     Breath sounds: Normal breath sounds. No decreased breath sounds, wheezing, rhonchi or rales.  Chest:     Chest wall: No tenderness.  Abdominal:     General: Abdomen is flat. There is no distension.     Palpations: Abdomen is soft.     Tenderness: There is no abdominal tenderness. There is no right CVA tenderness, left CVA tenderness or guarding.   Musculoskeletal:        General: Signs of injury (Noted to have a small well-healing abrasion to his right shin) present. No swelling or deformity.      Cervical back: Normal range of motion and neck supple. No rigidity or tenderness.     Right lower leg: No edema.     Left lower leg: No edema.   Skin:    General: Skin is warm and dry.     Findings: No bruising or rash.   Neurological:     General: No focal deficit present.     Mental Status: He is alert and oriented to person, place, and time. Mental status is at baseline.     Sensory: No sensory deficit.     Motor: No weakness.     Coordination: Coordination normal.     Gait: Gait normal.   Psychiatric:        Mood and Affect: Mood normal.     (all labs ordered are listed, but only abnormal results are displayed) Labs Reviewed  CBC - Abnormal; Notable for the following components:      Result Value   Hemoglobin 17.2 (*)    MCHC 36.7 (*)    All other components within normal limits  TROPONIN T, HIGH SENSITIVITY - Abnormal; Notable for the following components:   Troponin T High Sensitivity 80 (*)    All other components within normal limits  TROPONIN T, HIGH SENSITIVITY - Abnormal; Notable for the following components:   Troponin T High Sensitivity 86 (*)    All other components within normal limits  BASIC METABOLIC PANEL WITH GFR  DIFFERENTIAL    EKG: EKG Interpretation Date/Time:  Monday December 06 2023 14:20:07 EDT Ventricular Rate:  99 PR Interval:  164 QRS Duration:  94 QT Interval:  338 QTC Calculation: 433 R Axis:   51  Text Interpretation: Normal sinus rhythm Right atrial enlargement Cannot rule out Anterior infarct , age undetermined Abnormal ECG When compared with ECG of 21-Jun-2020 04:47, anterior t wave abnormality has improved nonspecific ST abnormality present no STEMI Confirmed by Francesca Fallow ([id number]) on 12/06/2023 5:23:02 PM  Radiology: DG Chest 2 View Result Date: 12/06/2023 CLINICAL DATA:  Chest pain EXAM: CHEST - 2 VIEW COMPARISON:  04/16/2020 FINDINGS: The heart size and mediastinal contours are within normal limits. Both lungs are clear.  The visualized skeletal structures are unremarkable. No pneumothorax. IMPRESSION: No active cardiopulmonary disease. Electronically Signed   By: Franky Crease M.D.   On: 12/06/2023 15:29     .Critical Care  Performed by: Beola Terrall RAMAN, PA-C Authorized by: Beola Terrall RAMAN, PA-C   Critical care provider statement:    Critical care time (minutes):  42   Critical care was necessary to treat or prevent imminent or life-threatening deterioration of the following conditions:  Cardiac failure  Critical care was time spent personally by me on the following activities:  Development of treatment plan with patient or surrogate, discussions with consultants, evaluation of patient's response to treatment, examination of patient, ordering and review of laboratory studies, ordering and review of radiographic studies, ordering and performing treatments and interventions, pulse oximetry, re-evaluation of patient's condition, review of old charts and obtaining history from patient or surrogate   Care discussed with: admitting provider and accepting provider at another facility      Medications Ordered in the ED  aspirin  chewable tablet 324 mg (324 mg Oral Given 12/06/23 1735)             HEART Score: 8                    Medical Decision Making Amount and/or Complexity of Data Reviewed Labs: ordered. Radiology: ordered.   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  Has not taken any nitro.  Currently asymptomatic.  On physical exam, patient is in no acute distress, afebrile, alert and orient x 4, speaking in full sentences, nontachypneic, nontachycardic.  LCTAB, RRR, no murmur, no  JVD, no lower leg edema, no abdominal tenderness, no chest wall tenderness.  Unremarkable exam otherwise.  With patient currently being asymptomatic, will obtain chest pain labs and imaging.  Provided aspirin  due to patient not having taken any of his medications today.  Troponin was elevated at 80, with delta troponin noted to be 86.  With patient still being asymptomatic, cardiology was contacted and Dr. Lavona was spoken with who wished to have this patient admitted to cardiology service.  On reevaluation, patient is noted to still be resting comfortably, not short of breath and not currently experiencing any chest pain.  He is acceptable to being admitted.  Bed request was made and patient care was transferred over to Dr. Judythe at that time who would have this patient admitted to [facility] cardiology service.  Differential diagnoses prior to evaluation: The emergent differential diagnosis includes, but is not limited to, ACS, AAS, Pulmonary Embolism, Tension Pneumothorax, Esophageal Rupture, Cardiac Tamponade, Pericarditis, Myocarditis, Pneumothorax, Pneumonia, Aortic Stenosis, CHF Exacerbation, GERD,  Esophageal Spasm,  Mallory-Weiss, Costochondritis, Musculoskeletal Chest Wall Pain, Anxiety / Panic Attack. This is not an exhaustive differential.   Past Medical History / Co-morbidities / Social History: STEMI involving left anterior descending coronary artery, tobacco abuse, ischemic cardiomyopathy, HLD  Additional history: Chart reviewed. Pertinent results include:   Last echo done on 2022 showing EF of 55% with no LVH.  Also noted to have left heart cath, noting proximal LAD to mid LAD lesion, 70% stenosed, mid LAD lesion 50% stenosed, drug-eluting stent placed 06/2020.  Stent placement done by Dr. Ozell Fell.  Last seen by cardiology on 11/02/2022 for by Lelon Hamilton, PA-C, noting no angina at that time.  Stopping Brilinta  at that time.  Continuing on aspirin , Zetia , Lipitor ,  metoprolol , nitroglycerin  as needed.  Lab Tests/Imaging studies: I personally interpreted labs/imaging and the pertinent results include:   CBC notes a mildly elevated hemoglobin of 7.2 otherwise unremarkable BMP unremarkable Troponin noted to be 80 with delta of 86 Chest x-ray unremarkable I agree with  the radiologist interpretation.  Cardiac monitoring: EKG obtained and interpreted by myself and attending physician which shows: Sinus rhythm with nonspecific ST abnormality  EKG Interpretation Date/Time:  Monday December 06 2023 14:20:07 EDT Ventricular Rate:  99 PR Interval:  164 QRS Duration:  94 QT Interval:  338 QTC Calculation: 433 R Axis:   51  Text Interpretation: Normal sinus rhythm Right atrial enlargement Cannot rule out Anterior infarct , age undetermined Abnormal ECG When compared with ECG of 21-Jun-2020 04:47, anterior t wave abnormality has improved nonspecific ST abnormality present no STEMI Confirmed by Francesca Fallow ([id number]) on 12/06/2023 5:23:02 PM          Medications: I ordered medication including aspirin .  I have reviewed the patients home medicines and have made adjustments as needed.  Critical Interventions: Spoke to cardiology service and admitted to cardiology service for NSTEMI  Social Determinants of Health: Notably is not taking his medications as they are prescribed  Disposition: After consideration of the diagnostic results and the patients response to treatment, I feel that the patient would benefit from admission to cardiology service, Dr. Lavona accepting patient care at this time, with pending transfer to [facility] to be admitted to the cardiology service.    Final diagnoses:  Elevated troponin  Chest pain, unspecified type  NSTEMI (non-ST elevated myocardial infarction) [facility])    ED Discharge Orders     None          Beola Terrall GORMAN DEVONNA 12/06/23 1752    Francesca Fallow CROME, MD 12/07/23 1335

## 2023-12-07 ENCOUNTER — Inpatient Hospital Stay (HOSPITAL_COMMUNITY)

## 2023-12-07 ENCOUNTER — Encounter (HOSPITAL_COMMUNITY)
Admission: EM | Disposition: A | Discharge status: Home / Self Care | Payer: Self-pay | Source: Home / Self Care | Attending: Student

## 2023-12-07 ENCOUNTER — Encounter (HOSPITAL_COMMUNITY): Payer: Self-pay | Admitting: Internal Medicine

## 2023-12-07 DIAGNOSIS — I200 Unstable angina: Secondary | ICD-10-CM

## 2023-12-07 DIAGNOSIS — I25110 Atherosclerotic heart disease of native coronary artery with unstable angina pectoris: Secondary | ICD-10-CM

## 2023-12-07 DIAGNOSIS — R079 Chest pain, unspecified: Secondary | ICD-10-CM

## 2023-12-07 DIAGNOSIS — R7989 Other specified abnormal findings of blood chemistry: Secondary | ICD-10-CM | POA: Diagnosis not present

## 2023-12-07 HISTORY — PX: LEFT HEART CATH AND CORONARY ANGIOGRAPHY: CATH118249

## 2023-12-07 HISTORY — PX: CORONARY STENT INTERVENTION: CATH118234

## 2023-12-07 LAB — DIFFERENTIAL
Abs Immature Granulocytes: 0.02 10*3/uL (ref 0.00–0.07)
Basophils Absolute: 0 10*3/uL (ref 0.0–0.1)
Basophils Relative: 0 %
Eosinophils Absolute: 0 10*3/uL (ref 0.0–0.5)
Eosinophils Relative: 0 %
Immature Granulocytes: 0 %
Lymphocytes Relative: 19 %
Lymphs Abs: 1.7 10*3/uL (ref 0.7–4.0)
Monocytes Absolute: 0.8 10*3/uL (ref 0.1–1.0)
Monocytes Relative: 9 %
Neutro Abs: 6.5 10*3/uL (ref 1.7–7.7)
Neutrophils Relative %: 72 %

## 2023-12-07 LAB — BASIC METABOLIC PANEL WITH GFR
Anion gap: 10 (ref 5–15)
BUN: 9 mg/dL (ref 6–20)
CO2: 24 mmol/L (ref 22–32)
Calcium: 9.2 mg/dL (ref 8.9–10.3)
Chloride: 103 mmol/L (ref 98–111)
Creatinine, Ser: 1.1 mg/dL (ref 0.61–1.24)
GFR, Estimated: >60 mL/min (ref >60)
Glucose, Bld: 109 mg/dL — ABNORMAL HIGH (ref 70–99)
Potassium: 4.2 mmol/L (ref 3.5–5.1)
Sodium: 137 mmol/L (ref 135–145)

## 2023-12-07 LAB — ECHOCARDIOGRAM COMPLETE
AR max vel: 2.46 cm2
AV Area VTI: 2.57 cm2
AV Area mean vel: 2.37 cm2
AV Mean grad: 4 mmHg
AV Peak grad: 7.5 mmHg
Ao pk vel: 1.37 m/s
Area-P 1/2: 3.03 cm2
Height: 72 in
S' Lateral: 3.1 cm
Weight: 2800.72 [oz_av]

## 2023-12-07 LAB — PROTIME-INR
INR: 1 (ref 0.8–1.2)
Prothrombin Time: 13.7 s (ref 11.4–15.2)

## 2023-12-07 LAB — LIPID PANEL
Cholesterol: 137 mg/dL (ref 0–200)
HDL: 25 mg/dL — ABNORMAL LOW (ref >40)
LDL Cholesterol: 75 mg/dL (ref 0–99)
Total CHOL/HDL Ratio: 5.5 ratio
Triglycerides: 187 mg/dL — ABNORMAL HIGH (ref <150)
VLDL: 37 mg/dL (ref 0–40)

## 2023-12-07 LAB — CBC
HCT: 49.4 % (ref 39.0–52.0)
Hemoglobin: 17.9 g/dL — ABNORMAL HIGH (ref 13.0–17.0)
MCH: 31.6 pg (ref 26.0–34.0)
MCHC: 36.2 g/dL — ABNORMAL HIGH (ref 30.0–36.0)
MCV: 87.3 fL (ref 80.0–100.0)
Platelets: 297 10*3/uL (ref 150–400)
RBC: 5.66 MIL/uL (ref 4.22–5.81)
RDW: 12.7 % (ref 11.5–15.5)
WBC: 7 10*3/uL (ref 4.0–10.5)
nRBC: 0 % (ref 0.0–0.2)

## 2023-12-07 LAB — HEPARIN LEVEL (UNFRACTIONATED): Heparin Unfractionated: <0.1 [IU]/mL — ABNORMAL LOW (ref 0.30–0.70)

## 2023-12-07 LAB — POCT ACTIVATED CLOTTING TIME: Activated Clotting Time: 389 s

## 2023-12-07 SURGERY — LEFT HEART CATH AND CORONARY ANGIOGRAPHY
Anesthesia: LOCAL

## 2023-12-07 MED ORDER — TICAGRELOR 90 MG PO TABS
ORAL_TABLET | ORAL | Status: AC
  Filled 2023-12-07: qty 2

## 2023-12-07 MED ORDER — ASPIRIN 81 MG PO CHEW
81.0000 mg | CHEWABLE_TABLET | Freq: Every day | ORAL | Status: DC
Start: 2023-12-08 — End: 2023-12-08
  Administered 2023-12-08: 81 mg via ORAL
  Filled 2023-12-07: qty 1

## 2023-12-07 MED ORDER — SODIUM CHLORIDE 0.9 % IV SOLN: 250.0000 mL | INTRAVENOUS | Status: DC | PRN

## 2023-12-07 MED ORDER — HEPARIN (PORCINE) IN NACL 1000-0.9 UT/500ML-% IV SOLN
INTRAVENOUS | Status: DC | PRN
  Administered 2023-12-07: 1500 mL via SURGICAL_CAVITY

## 2023-12-07 MED ORDER — SODIUM CHLORIDE 0.9 % WEIGHT BASED INFUSION
3.0000 mL/kg/h | INTRAVENOUS | Status: DC
  Administered 2023-12-07: 3 mL/kg/h via INTRAVENOUS

## 2023-12-07 MED ORDER — SODIUM CHLORIDE 0.9 % IV SOLN
INTRAVENOUS | Status: AC | PRN
  Administered 2023-12-07: 250 mL via INTRAVENOUS

## 2023-12-07 MED ORDER — TICAGRELOR 90 MG PO TABS
90.0000 mg | ORAL_TABLET | Freq: Two times a day (BID) | ORAL | Status: DC
  Administered 2023-12-08: 90 mg via ORAL
  Filled 2023-12-07: qty 1

## 2023-12-07 MED ORDER — LIDOCAINE HCL (PF) 1 % IJ SOLN
INTRAMUSCULAR | Status: AC
  Filled 2023-12-07: qty 30

## 2023-12-07 MED ORDER — HEPARIN SODIUM (PORCINE) 1000 UNIT/ML IJ SOLN
INTRAMUSCULAR | Status: DC | PRN
Start: 2023-12-07 — End: 2023-12-07
  Administered 2023-12-07: 5000 [IU] via INTRA_ARTERIAL
  Administered 2023-12-07: 4000 [IU] via INTRA_ARTERIAL

## 2023-12-07 MED ORDER — VERAPAMIL HCL 2.5 MG/ML IV SOLN
INTRAVENOUS | Status: AC
  Filled 2023-12-07: qty 2

## 2023-12-07 MED ORDER — HYDRALAZINE HCL 20 MG/ML IJ SOLN
10.0000 mg | INTRAMUSCULAR | Status: AC | PRN
Start: 2023-12-07 — End: 2023-12-08

## 2023-12-07 MED ORDER — HEPARIN SODIUM (PORCINE) 1000 UNIT/ML IJ SOLN
INTRAMUSCULAR | Status: AC
  Filled 2023-12-07: qty 10

## 2023-12-07 MED ORDER — MIDAZOLAM HCL 2 MG/2ML IJ SOLN
INTRAMUSCULAR | Status: AC
  Filled 2023-12-07: qty 2

## 2023-12-07 MED ORDER — FENTANYL CITRATE (PF) 100 MCG/2ML IJ SOLN
INTRAMUSCULAR | Status: AC
  Filled 2023-12-07: qty 2

## 2023-12-07 MED ORDER — LABETALOL HCL 5 MG/ML IV SOLN: 10.0000 mg | INTRAVENOUS | Status: AC | PRN

## 2023-12-07 MED ORDER — ASPIRIN 81 MG PO CHEW: 81.0000 mg | CHEWABLE_TABLET | ORAL | Status: DC

## 2023-12-07 MED ORDER — SODIUM CHLORIDE 0.9% FLUSH
3.0000 mL | Freq: Two times a day (BID) | INTRAVENOUS | Status: DC
  Administered 2023-12-07 – 2023-12-08 (×2): 3 mL via INTRAVENOUS

## 2023-12-07 MED ORDER — SODIUM CHLORIDE 0.9 % IV SOLN
INTRAVENOUS | Status: DC | PRN
  Administered 2023-12-07: 500 mL via INTRAVENOUS

## 2023-12-07 MED ORDER — SODIUM CHLORIDE 0.9 % IV SOLN: INTRAVENOUS | Status: AC

## 2023-12-07 MED ORDER — FENTANYL CITRATE (PF) 100 MCG/2ML IJ SOLN
INTRAMUSCULAR | Status: DC | PRN
  Administered 2023-12-07 (×2): 25 ug via INTRAVENOUS

## 2023-12-07 MED ORDER — SODIUM CHLORIDE 0.9 % WEIGHT BASED INFUSION
1.0000 mL/kg/h | INTRAVENOUS | Status: DC
  Administered 2023-12-07: 1 mL/kg/h via INTRAVENOUS

## 2023-12-07 MED ORDER — LIDOCAINE HCL (PF) 1 % IJ SOLN
INTRAMUSCULAR | Status: DC | PRN
  Administered 2023-12-07: 5 mL

## 2023-12-07 MED ORDER — SODIUM CHLORIDE 0.9% FLUSH: 3.0000 mL | INTRAVENOUS | Status: DC | PRN

## 2023-12-07 MED ORDER — TICAGRELOR 90 MG PO TABS
ORAL_TABLET | ORAL | Status: DC | PRN
  Administered 2023-12-07: 180 mg via ORAL

## 2023-12-07 MED ORDER — HEPARIN (PORCINE) IN NACL 2-0.9 UNITS/ML
INTRAMUSCULAR | Status: DC | PRN
  Administered 2023-12-07: 10 mL via INTRA_ARTERIAL

## 2023-12-07 MED ORDER — IOHEXOL 350 MG/ML SOLN
INTRAVENOUS | Status: DC | PRN
  Administered 2023-12-07: 70 mL via INTRA_ARTERIAL

## 2023-12-07 MED ORDER — MIDAZOLAM HCL 2 MG/2ML IJ SOLN
INTRAMUSCULAR | Status: DC | PRN
  Administered 2023-12-07 (×2): 1 mg via INTRAVENOUS

## 2023-12-07 SURGICAL SUPPLY — 15 items
BALLOON EMERGE MR 2.5X12 (BALLOONS) IMPLANT
BALLOON ~~LOC~~ EMERGE MR 3.25X12 (BALLOONS) IMPLANT
CATH INFINITI 6F ANG MULTIPACK (CATHETERS) IMPLANT
CATH INFINITI 6F FL3.5 (CATHETERS) IMPLANT
CATH VISTA GUIDE 6FR JR4 ECOPK (CATHETERS) IMPLANT
DEVICE RAD COMP TR BAND LRG (VASCULAR PRODUCTS) IMPLANT
GLIDESHEATH SLEND SS 6F .021 (SHEATH) IMPLANT
GUIDEWIRE VAS SION BLUE 190 (WIRE) IMPLANT
KIT ENCORE 26 ADVANTAGE (KITS) IMPLANT
KIT HEMO VALVE WATCHDOG (MISCELLANEOUS) IMPLANT
KIT SYRINGE INJ CVI SPIKEX1 (MISCELLANEOUS) IMPLANT
PACK CARDIAC CATHETERIZATION (CUSTOM PROCEDURE TRAY) ×1 IMPLANT
SET ATX-X65L (MISCELLANEOUS) IMPLANT
STENT SYNERGY XD 3.0X20 (Permanent Stent) IMPLANT
WIRE EMERALD 3MM-J .035X260CM (WIRE) IMPLANT

## 2023-12-07 NOTE — Interval H&P Note (Signed)
 History and Physical Interval Note:  12/07/2023 3:17 PM  Martin Johnston  has presented today for surgery, with the diagnosis of unstable angina.  The various methods of treatment have been discussed with the patient and family. After consideration of risks, benefits and other options for treatment, the patient has consented to  Procedure(s): LEFT HEART CATH AND CORONARY ANGIOGRAPHY (N/A) as a surgical intervention.  The patient's history has been reviewed, patient examined, no change in status, stable for surgery.  I have reviewed the patient's chart and labs.  Questions were answered to the patient's satisfaction.     Lyndle Pang K Alisyn Lequire

## 2023-12-07 NOTE — Progress Notes (Signed)
 Rounding Note   Patient Name: Martin Johnston Date of Encounter: 12/07/2023  [facility] Cardiologist: Ozell Fell, MD   Subjective Patient mated last night with unstable angina.  Currently pain-free on IV heparin .  Scheduled for left heart cath this morning.  Scheduled Meds:  aspirin  EC  81 mg Oral Daily   atorvastatin   80 mg Oral Daily   ezetimibe   10 mg Oral Daily   lisinopril   2.5 mg Oral Daily   metoprolol  tartrate  12.5 mg Oral BID   Continuous Infusions:  heparin  900 Units/hr (12/06/23 2319)   PRN Meds: acetaminophen , nitroGLYCERIN , ondansetron  (ZOFRAN ) IV   Vital Signs  Vitals:   12/06/23 2200 12/07/23 0028 12/07/23 0621 12/07/23 0740  BP:  108/76 112/67 117/69  Pulse:  62 63 60  Resp:  18 18 18   Temp:  97.9 F (36.6 C) 97.7 F (36.5 C) 97.8 F (36.6 C)  TempSrc:  Oral Oral Oral  SpO2:  94% 95% 100%  Weight: 79.4 kg     Height: 6' (1.829 m)       Intake/Output Summary (Last 24 hours) at 12/07/2023 0848 Last data filed at 12/07/2023 0500 Gross per 24 hour  Intake --  Output 525 ml  Net -525 ml      12/06/2023   10:00 PM 12/06/2023    5:35 PM 11/11/2022    7:57 AM  Last 3 Weights  Weight (lbs) 175 lb 0.7 oz 175 lb 174 lb  Weight (kg) 79.4 kg 79.379 kg 78.926 kg      Telemetry Sinus rhythm with PVCs- Personally Reviewed  ECG  Sinus bradycardia 58 without ST or T wave changes- Personally Reviewed  Physical Exam  GEN: No acute distress.   Neck: No JVD Cardiac: RRR, no murmurs, rubs, or gallops.  Respiratory: Clear to auscultation bilaterally. GI: Soft, nontender, non-distended  MS: No edema; No deformity. Neuro:  Nonfocal  Psych: Normal affect   Labs High Sensitivity Troponin:  No results for input(s): TROPONINIHS in the last 720 hours.   Chemistry Recent Labs  Lab 12/06/23 1424 12/07/23 0724  NA 138 137  K 4.1 4.2  CL 102 103  CO2 22 24  GLUCOSE 94 109*  BUN 9 9  CREATININE 1.22 1.10  CALCIUM  10.0 9.2   GFRNONAA >60 >60  ANIONGAP 14 10    Lipids  Recent Labs  Lab 12/07/23 0724  CHOL 137  TRIG 187*  HDL 25*  LDLCALC 75  CHOLHDL 5.5    Hematology Recent Labs  Lab 12/06/23 1424 12/07/23 0724  WBC 9.1 7.0  RBC 5.43 5.66  HGB 17.2* 17.9*  HCT 46.9 49.4  MCV 86.4 87.3  MCH 31.7 31.6  MCHC 36.7* 36.2*  RDW 12.7 12.7  PLT 305 297   Thyroid No results for input(s): TSH, FREET4 in the last 168 hours.  BNPNo results for input(s): BNP, PROBNP in the last 168 hours.  DDimer No results for input(s): DDIMER in the last 168 hours.   Radiology  DG Chest 2 View Result Date: 12/06/2023 CLINICAL DATA:  Chest pain EXAM: CHEST - 2 VIEW COMPARISON:  04/16/2020 FINDINGS: The heart size and mediastinal contours are within normal limits. Both lungs are clear. The visualized skeletal structures are unremarkable. No pneumothorax. IMPRESSION: No active cardiopulmonary disease. Electronically Signed   By: Franky Crease M.D.   On: 12/06/2023 15:29    Cardiac Studies None  Patient Profile   Martin Johnston is a 48 y.o. male with anterolateral STEMI (  06/2020 status post DES to LAD), HLD, HTN, tobacco use, insulin resistance who is being seen 12/06/2023 for the evaluation of chest pain.   Assessment & Plan  1: Unstable angina-history of anterolateral STEMI 1/22 status post LAD PCI and stenting.  He has complained of chest pain over the last several days which is off-and-on reminiscent of his prior symptoms but not as intense.  His enzymes are elevated but flat and his EKG shows no acute changes.  He said note chest pain since he has been hospitalized.  Plan for left heart cath this morning.  I have reviewed the risks, indications, and alternatives to cardiac catheterization, possible angioplasty, and stenting with the patient. Risks include but are not limited to bleeding, infection, vascular injury, stroke, myocardial infection, arrhythmia, kidney injury, radiation-related injury in the case  of prolonged fluoroscopy use, emergency cardiac surgery, and death. The patient understands the risks of serious complication is 1-2 in 1000 with diagnostic cardiac cath and 1-2% or less with angioplasty/stenting.  2: Essential hypertension-blood pressure well-controlled on lisinopril  and metoprolol .  3: Hyperlipidemia-on high-dose statin therapy  4: Tobacco abuse-ongoing, recalcitrant to resector modification.  Counseled about the importance of cessation.   For questions or updates, please contact [facility] Please consult [email or web address] for contact info under     Signed, Dorn Lesches, MD  12/07/2023, 8:48 AM

## 2023-12-07 NOTE — Progress Notes (Signed)
 PHARMACY - ANTICOAGULATION CONSULT NOTE  Pharmacy Consult for heparin  Indication: chest pain/ACS  Allergies  Allergen Reactions   Penicillins Other (See Comments)    UPSET STOMACH   Vicodin [Hydrocodone-Acetaminophen ] Other (See Comments)    Unknown reaction     Patient Measurements: Height: 6' (182.9 cm) Weight: 79.4 kg (175 lb 0.7 oz) IBW/kg (Calculated) : 77.6 HEPARIN  DW (KG): 79.4  Vital Signs: Temp: 97.8 F (36.6 C) (06/24 0740) Temp Source: Oral (06/24 0740) BP: 117/69 (06/24 0740) Pulse Rate: 60 (06/24 0740)  Labs: Recent Labs    12/06/23 1424 12/07/23 0724  HGB 17.2* 17.9*  HCT 46.9 49.4  PLT 305 297  LABPROT  --  13.7  INR  --  1.0  HEPARINUNFRC  --  <0.10*  CREATININE 1.22 1.10    Estimated Creatinine Clearance: 89.2 mL/min (by C-G formula based on SCr of 1.1 mg/dL).   Medical History: Past Medical History:  Diagnosis Date   CAD S/P percutaneous coronary angioplasty    Anterolateral STEMI in 06/2020 s/p DES to pLAD    HLD (hyperlipidemia)    Ischemic cardiomyopathy    Echo 1/22: EF 40-45 // Echocardiogram 3/22: EF 50-55    Tobacco abuse       Assessment: 48yo male w/ significant cardiac hx but medical nonadherence c/o intermittent CP concerning for USAP, awaiting cardiac cath. No anticoagulation prior to admission. Pharmacy consulted for heparin .    Heparin  level <0.1 is subtherapeutic on 900 units/hr. Heparin  was turned off at *** for cath.   Goal of Therapy:  Heparin  level 0.3-0.7 units/ml Monitor platelets by anticoagulation protocol: Yes   Plan:  Heparin  900 units/hr Monitor daily heparin  level, CBC, signs/symptoms of bleeding  F/u post-cath   Jinnie Door, PharmD, BCPS, BCCP Clinical Pharmacist  Please check AMION for all [facility] Pharmacy phone numbers After 10:00 PM, call Main Pharmacy [phone]

## 2023-12-07 NOTE — Progress Notes (Signed)
*  PRELIMINARY RESULTS* Echocardiogram 2D Echocardiogram has been performed.  Martin Johnston 12/07/2023, 1:41 PM

## 2023-12-07 NOTE — H&P (View-Only) (Signed)
 Rounding Note   Patient Name: Martin Johnston Date of Encounter: 12/07/2023  [facility] Cardiologist: Ozell Fell, MD   Subjective Patient mated last night with unstable angina.  Currently pain-free on IV heparin .  Scheduled for left heart cath this morning.  Scheduled Meds:  aspirin  EC  81 mg Oral Daily   atorvastatin   80 mg Oral Daily   ezetimibe   10 mg Oral Daily   lisinopril   2.5 mg Oral Daily   metoprolol  tartrate  12.5 mg Oral BID   Continuous Infusions:  heparin  900 Units/hr (12/06/23 2319)   PRN Meds: acetaminophen , nitroGLYCERIN , ondansetron  (ZOFRAN ) IV   Vital Signs  Vitals:   12/06/23 2200 12/07/23 0028 12/07/23 0621 12/07/23 0740  BP:  108/76 112/67 117/69  Pulse:  62 63 60  Resp:  18 18 18   Temp:  97.9 F (36.6 C) 97.7 F (36.5 C) 97.8 F (36.6 C)  TempSrc:  Oral Oral Oral  SpO2:  94% 95% 100%  Weight: 79.4 kg     Height: 6' (1.829 m)       Intake/Output Summary (Last 24 hours) at 12/07/2023 0848 Last data filed at 12/07/2023 0500 Gross per 24 hour  Intake --  Output 525 ml  Net -525 ml      12/06/2023   10:00 PM 12/06/2023    5:35 PM 11/11/2022    7:57 AM  Last 3 Weights  Weight (lbs) 175 lb 0.7 oz 175 lb 174 lb  Weight (kg) 79.4 kg 79.379 kg 78.926 kg      Telemetry Sinus rhythm with PVCs- Personally Reviewed  ECG  Sinus bradycardia 58 without ST or T wave changes- Personally Reviewed  Physical Exam  GEN: No acute distress.   Neck: No JVD Cardiac: RRR, no murmurs, rubs, or gallops.  Respiratory: Clear to auscultation bilaterally. GI: Soft, nontender, non-distended  MS: No edema; No deformity. Neuro:  Nonfocal  Psych: Normal affect   Labs High Sensitivity Troponin:  No results for input(s): TROPONINIHS in the last 720 hours.   Chemistry Recent Labs  Lab 12/06/23 1424 12/07/23 0724  NA 138 137  K 4.1 4.2  CL 102 103  CO2 22 24  GLUCOSE 94 109*  BUN 9 9  CREATININE 1.22 1.10  CALCIUM  10.0 9.2   GFRNONAA >60 >60  ANIONGAP 14 10    Lipids  Recent Labs  Lab 12/07/23 0724  CHOL 137  TRIG 187*  HDL 25*  LDLCALC 75  CHOLHDL 5.5    Hematology Recent Labs  Lab 12/06/23 1424 12/07/23 0724  WBC 9.1 7.0  RBC 5.43 5.66  HGB 17.2* 17.9*  HCT 46.9 49.4  MCV 86.4 87.3  MCH 31.7 31.6  MCHC 36.7* 36.2*  RDW 12.7 12.7  PLT 305 297   Thyroid No results for input(s): TSH, FREET4 in the last 168 hours.  BNPNo results for input(s): BNP, PROBNP in the last 168 hours.  DDimer No results for input(s): DDIMER in the last 168 hours.   Radiology  DG Chest 2 View Result Date: 12/06/2023 CLINICAL DATA:  Chest pain EXAM: CHEST - 2 VIEW COMPARISON:  04/16/2020 FINDINGS: The heart size and mediastinal contours are within normal limits. Both lungs are clear. The visualized skeletal structures are unremarkable. No pneumothorax. IMPRESSION: No active cardiopulmonary disease. Electronically Signed   By: Franky Crease M.D.   On: 12/06/2023 15:29    Cardiac Studies None  Patient Profile   Martin Johnston is a 48 y.o. male with anterolateral STEMI (  06/2020 status post DES to LAD), HLD, HTN, tobacco use, insulin resistance who is being seen 12/06/2023 for the evaluation of chest pain.   Assessment & Plan  1: Unstable angina-history of anterolateral STEMI 1/22 status post LAD PCI and stenting.  He has complained of chest pain over the last several days which is off-and-on reminiscent of his prior symptoms but not as intense.  His enzymes are elevated but flat and his EKG shows no acute changes.  He said note chest pain since he has been hospitalized.  Plan for left heart cath this morning.  I have reviewed the risks, indications, and alternatives to cardiac catheterization, possible angioplasty, and stenting with the patient. Risks include but are not limited to bleeding, infection, vascular injury, stroke, myocardial infection, arrhythmia, kidney injury, radiation-related injury in the case  of prolonged fluoroscopy use, emergency cardiac surgery, and death. The patient understands the risks of serious complication is 1-2 in 1000 with diagnostic cardiac cath and 1-2% or less with angioplasty/stenting.  2: Essential hypertension-blood pressure well-controlled on lisinopril  and metoprolol .  3: Hyperlipidemia-on high-dose statin therapy  4: Tobacco abuse-ongoing, recalcitrant to resector modification.  Counseled about the importance of cessation.   For questions or updates, please contact [facility] Please consult [email or web address] for contact info under     Signed, Dorn Lesches, MD  12/07/2023, 8:48 AM

## 2023-12-08 ENCOUNTER — Other Ambulatory Visit (HOSPITAL_COMMUNITY): Payer: Self-pay

## 2023-12-08 ENCOUNTER — Telehealth (HOSPITAL_COMMUNITY): Payer: Self-pay | Admitting: Pharmacy Technician

## 2023-12-08 ENCOUNTER — Telehealth (HOSPITAL_COMMUNITY): Payer: Self-pay

## 2023-12-08 DIAGNOSIS — R7989 Other specified abnormal findings of blood chemistry: Secondary | ICD-10-CM | POA: Diagnosis not present

## 2023-12-08 DIAGNOSIS — I200 Unstable angina: Secondary | ICD-10-CM | POA: Diagnosis not present

## 2023-12-08 LAB — BASIC METABOLIC PANEL WITH GFR
Anion gap: 9 (ref 5–15)
BUN: 11 mg/dL (ref 6–20)
CO2: 24 mmol/L (ref 22–32)
Calcium: 8.8 mg/dL — ABNORMAL LOW (ref 8.9–10.3)
Chloride: 104 mmol/L (ref 98–111)
Creatinine, Ser: 1.1 mg/dL (ref 0.61–1.24)
GFR, Estimated: >60 mL/min (ref >60)
Glucose, Bld: 102 mg/dL — ABNORMAL HIGH (ref 70–99)
Potassium: 4.3 mmol/L (ref 3.5–5.1)
Sodium: 137 mmol/L (ref 135–145)

## 2023-12-08 LAB — HEPARIN LEVEL (UNFRACTIONATED): Heparin Unfractionated: <0.1 [IU]/mL — ABNORMAL LOW (ref 0.30–0.70)

## 2023-12-08 LAB — CBC
HCT: 46.3 % (ref 39.0–52.0)
Hemoglobin: 16.2 g/dL (ref 13.0–17.0)
MCH: 31.2 pg (ref 26.0–34.0)
MCHC: 35 g/dL (ref 30.0–36.0)
MCV: 89.2 fL (ref 80.0–100.0)
Platelets: 271 10*3/uL (ref 150–400)
RBC: 5.19 MIL/uL (ref 4.22–5.81)
RDW: 12.7 % (ref 11.5–15.5)
WBC: 7.2 10*3/uL (ref 4.0–10.5)
nRBC: 0 % (ref 0.0–0.2)

## 2023-12-08 MED ORDER — LISINOPRIL 2.5 MG PO TABS
2.5000 mg | ORAL_TABLET | Freq: Every day | ORAL | 3 refills | Status: DC
  Filled 2023-12-08: qty 90, 90d supply, fill #0

## 2023-12-08 MED ORDER — NITROGLYCERIN 0.4 MG SL SUBL
0.4000 mg | SUBLINGUAL_TABLET | SUBLINGUAL | 3 refills | Status: DC | PRN
  Filled 2023-12-08: qty 25, 5d supply, fill #0

## 2023-12-08 MED ORDER — METOPROLOL SUCCINATE ER 25 MG PO TB24
12.5000 mg | ORAL_TABLET | Freq: Every day | ORAL | Status: DC
  Administered 2023-12-08: 12.5 mg via ORAL
  Filled 2023-12-08: qty 1

## 2023-12-08 MED ORDER — ASPIRIN EC 81 MG PO TBEC
81.0000 mg | DELAYED_RELEASE_TABLET | Freq: Every day | ORAL | 0 refills | Status: AC
Start: 2023-12-08 — End: ?
  Filled 2023-12-08: qty 120, 120d supply, fill #0

## 2023-12-08 MED ORDER — METOPROLOL SUCCINATE ER 25 MG PO TB24
12.5000 mg | ORAL_TABLET | Freq: Every day | ORAL | 3 refills | Status: DC
Start: 2023-12-08 — End: 2023-12-14
  Filled 2023-12-08: qty 45, 90d supply, fill #0

## 2023-12-08 MED ORDER — TICAGRELOR 90 MG PO TABS
90.0000 mg | ORAL_TABLET | Freq: Two times a day (BID) | ORAL | 3 refills | Status: DC
  Filled 2023-12-08: qty 180, 90d supply, fill #0

## 2023-12-08 MED ORDER — ATORVASTATIN CALCIUM 80 MG PO TABS
80.0000 mg | ORAL_TABLET | Freq: Every day | ORAL | 3 refills | Status: DC
  Filled 2023-12-08: qty 90, 90d supply, fill #0

## 2023-12-08 MED ORDER — EZETIMIBE 10 MG PO TABS
10.0000 mg | ORAL_TABLET | Freq: Every day | ORAL | 3 refills | Status: DC
  Filled 2023-12-08: qty 90, 90d supply, fill #0

## 2023-12-08 NOTE — Telephone Encounter (Signed)
 Patient Product/process development scientist completed.    The patient is insured through [employer] [facility]. Patient has [employer], may use a copay card, and/or apply for patient assistance if available.    Ran test claim for ticagrelor  (Brilinta ) 90 mg and the current 30 day co-pay is $10.00.  Ran test claim for Brilinta  90 mg and the current 30 day co-pay is $30.00.  This test claim was processed through  [facility]- copay amounts may vary at other pharmacies due to pharmacy/plan contracts, or as the patient moves through the different stages of their insurance plan.     Reyes Sharps, CPHT Pharmacy Technician III Certified Patient Advocate [facility] Patient Advocate Team Direct Number: [phone]  Fax: [phone]

## 2023-12-08 NOTE — Progress Notes (Signed)
 CARDIAC REHAB PHASE I   PRE:  Rate/Rhythm: 59 SB   BP:  Sitting: 105/66      SaO2: 98 RA   MODE:  Ambulation: 240 ft   POST:  Rate/Rhythm: 71 SR   BP:  Sitting: 121/84      SaO2: 98 RA   Pt ambulated independently in hallway. Tolerated well with no CP, SOB or dizziness. Returned to bed with call bell and bedside table in reach. Post MI/stent education including restrictions, risk factors, exercise guidelines, antiplatelet therapy importance, MI booklet, NTG use, heart healthy diet, smoking cessation and CRP2 reviewed. All questions and concerns addressed. Will refer to [facility] for CRP2. Plan for discharge home later today.   [id number] Vaughn Asberry Hacking, RN BSN 12/08/2023 9:57 AM

## 2023-12-08 NOTE — Plan of Care (Signed)
  Problem: Health Behavior/Discharge Planning: Goal: Ability to manage health-related needs will improve Outcome: Progressing   Problem: Clinical Measurements: Goal: Respiratory complications will improve Outcome: Progressing Goal: Cardiovascular complication will be avoided Outcome: Progressing   Problem: Elimination: Goal: Will not experience complications related to urinary retention Outcome: Progressing   Problem: Safety: Goal: Ability to remain free from injury will improve Outcome: Progressing   Problem: Activity: Goal: Ability to tolerate increased activity will improve Outcome: Progressing   Problem: Activity: Goal: Ability to return to baseline activity level will improve Outcome: Progressing   Problem: Cardiovascular: Goal: Ability to achieve and maintain adequate cardiovascular perfusion will improve Outcome: Progressing Goal: Vascular access site(s) Level 0-1 will be maintained Outcome: Progressing

## 2023-12-08 NOTE — Discharge Summary (Addendum)
 Discharge Summary   Patient ID: Martin Johnston MRN: [id number]; DOB: 09-Aug-1975  Admit date: 12/06/2023 Discharge date: 12/08/2023  PCP:  Patient, No Pcp Per   [facility] Providers Cardiologist:  Ozell Fell, MD  Cardiology APP:  Lelon Glendia DASEN, PA-C      Discharge Diagnoses  Principal Problem:   Elevated troponin Active Problems:   Tobacco abuse   Hyperlipidemia   CAD (coronary artery disease)   Ischemic cardiomyopathy   Chest pain   Diagnostic Studies/Procedures   LHC 12/07/23:   Mid LAD lesion is 30% stenosed.   Prox RCA to Mid RCA lesion is 95% stenosed.   Non-stenotic Prox LAD to Mid LAD lesion was previously treated.   A stent was successfully placed.   Post intervention, there is a 0% residual stenosis.   1.  New high-grade mid right coronary artery lesion treated with 1 drug-eluting stent. 2.  Widely patent proximal LAD stent with mild disease elsewhere. 3.  LVEDP of 8 mmHg.   Recommendation: Dual antiplatelet therapy for at least 6 months and preferably 1 year with aspirin  and Brilinta  followed by indefinite Brilinta  monotherapy.  Judicious IV fluid resuscitation.  The results were reviewed with the patient's mother. _____________  Echo 12/07/23:  1. Left ventricular ejection fraction, by estimation, is 55 to 60%. The  left ventricle has normal function. The left ventricle has no regional  wall motion abnormalities. Left ventricular diastolic parameters were  normal.   2. Right ventricular systolic function is normal. The right ventricular  size is normal.   3. The mitral valve is normal in structure. No evidence of mitral valve  regurgitation. No evidence of mitral stenosis.   4. 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 The aortic valve  is bicuspid. Aortic valve regurgitation is not visualized. No aortic  stenosis is present.   5. The inferior vena cava is normal in size with greater than 50%  respiratory variability,  suggesting right atrial pressure of 3 mmHg.    History of Present Illness   Martin Johnston is a 48 y.o. male with anterolateral STEMI (06/2020 status post DES to LAD), HLD, HTN, tobacco use, insulin resistance who is being seen 12/06/2023 for the evaluation of chest pain.   Martin Johnston states that he began having substernal chest pressure at rest intermittently yesterday that it was worse today on exertion when he was working under his house.  This brought him to the outside ED, EKG unchanged.  Trope 80-81. ED discussed with outpatient cardiologist with plan to admit for LHC.  States that this pain was not nearly as severe as his chest pain prior in 2022 but he cannot really tell me if it is similar in nature as his prior ACS.  He has been partially noncompliant with his aspirin  and lipid-lowering medications, states he just forgets to take it. Still smoking.  He currently does not have any chest pain, shortness of breath or syncope.    Hospital Course   Consultants: none  CAD NSTEMI Prior stenting to LAD in 2022 in the setting of a STEMI. LHC this admission demonstrated 95% stenosis in the prox-mid RCA successfully treated with PCI/DES 3.0 x 20mm synergy stent. He was started on ASA and brilinta  x 12 months followed by brilinta  indefinitely.  Baseline low-normal HR in the 60s. He is tolerated low dose BB - 12.5 mg toprol . He denies dizziness.    Hypertension Continue 2.5 mg lisinopril . BP low normal, will need to follow outpatient.  Hyperlipidemia with LDL goal < 55 Lower goal given smoking history.  12/07/2023: Cholesterol 137; HDL 25; LDL Cholesterol 75; Triglycerides 187; VLDL 37 Continue 80 mg lipitor  and zetia . He was not taking medications prior to admission.    Ischemic cardiomyopathy Echo at the time of his STEMI in 2022 showed LVEF 40-45%, which improved to 50% on follow up echo. Fortunately, LVEF remained preserved this admission.  Continue BB and ACEI, no room to titrate  in terms of BP or HR.    Tobacco abuse Encouraged smoking cessation.    Bicuspid aortic valve Noted on echo, no AS. No evidence of dilated root or ascending aorta. Will  need repeat echo in 1 year.    Nonadherence Stressed importance of medication compliance following stent placement.    Pt was seen and examined and deemed stable for discharge. Follow up has been arranged.   I called his [facility] pharmacy and confirmed they do carry the 90 mg ticagrelor  tablets, which should be $10 for him. Note, effient will also be $10 should we need to change.       Did the patient have an acute coronary syndrome (MI, NSTEMI, STEMI, etc) this admission?:  Yes                               AHA/ACC ACS Clinical Performance & Quality Measures: Aspirin  prescribed? - Yes ADP Receptor Inhibitor (Plavix/Clopidogrel, Brilinta /Ticagrelor  or Effient/Prasugrel) prescribed (includes medically managed patients)? - Yes Beta Blocker prescribed? - Yes High Intensity Statin (Lipitor  40-80mg  or Crestor 20-40mg ) prescribed? - Yes EF assessed during THIS hospitalization? - Yes For EF <40%, was ACEI/ARB prescribed? - Yes For EF <40%, Aldosterone Antagonist (Spironolactone or Eplerenone) prescribed? - Not Applicable (EF >/= 40%) Cardiac Rehab Phase II ordered (including medically managed patients)? - Yes       The patient will be scheduled for a TOC follow up appointment in 7-14 days.  A message has been sent to the [facility] and Scheduling Pool at the office where the patient should be seen for follow up.  _____________  Discharge Vitals Blood pressure 105/66, pulse (!) 51, temperature 97.6 F (36.4 C), temperature source Oral, resp. rate 18, height 6' (1.829 m), weight 80.9 kg, SpO2 98%.  Filed Weights   12/06/23 1735 12/06/23 2200 12/07/23 1819  Weight: 79.4 kg 79.4 kg 80.9 kg    Physical Exam   General: Well developed, well nourished, male appearing in no acute distress. Head: Normocephalic,  atraumatic.  Neck: Supple without bruits, JVD. Lungs:  Resp regular and unlabored, CTA. Heart: RRR, S1, S2, no S3, S4, or murmur; no rub. Abdomen: Soft, non-tender Extremities: No clubbing, cyanosis, edema. Distal pedal pulses are 2+ bilaterally. Neuro: Alert and oriented X 3. Moves all extremities spontaneously. Psych: Normal affect. Right radial C/D/I   Labs & Radiologic Studies  CBC Recent Labs    12/06/23 1640 12/07/23 0724 12/08/23 0428  WBC  --  7.0 7.2  NEUTROABS 6.5  --   --   HGB  --  17.9* 16.2  HCT  --  49.4 46.3  MCV  --  87.3 89.2  PLT  --  297 271   Basic Metabolic Panel Recent Labs    93/75/74 0724 12/08/23 0428  NA 137 137  K 4.2 4.3  CL 103 104  CO2 24 24  GLUCOSE 109* 102*  BUN 9 11  CREATININE 1.10 1.10  CALCIUM  9.2 8.8*  Liver Function Tests No results for input(s): AST, ALT, ALKPHOS, BILITOT, PROT, ALBUMIN in the last 72 hours. No results for input(s): LIPASE, AMYLASE in the last 72 hours. High Sensitivity Troponin:   No results for input(s): TROPONINIHS in the last 720 hours.  Recent Labs  Lab 12/06/23 1424 12/06/23 1631  TRNPT 80* 86*    BNP Invalid input(s): POCBNP No results for input(s): PROBNP in the last 72 hours.  No results for input(s): BNP in the last 72 hours.  D-Dimer No results for input(s): DDIMER in the last 72 hours. Hemoglobin A1C No results for input(s): HGBA1C in the last 72 hours. Fasting Lipid Panel Recent Labs    12/07/23 0724  CHOL 137  HDL 25*  LDLCALC 75  TRIG 812*  CHOLHDL 5.5   No results found for: LIPOA  Thyroid Function Tests No results for input(s): TSH, T4TOTAL, T3FREE, THYROIDAB in the last 72 hours.  Invalid input(s): FREET3 _____________  CARDIAC CATHETERIZATION Result Date: 12/07/2023   Mid LAD lesion is 30% stenosed.   Prox RCA to Mid RCA lesion is 95% stenosed.   Non-stenotic Prox LAD to Mid LAD lesion was previously treated.   A stent was  successfully placed.   Post intervention, there is a 0% residual stenosis. 1.  New high-grade mid right coronary artery lesion treated with 1 drug-eluting stent. 2.  Widely patent proximal LAD stent with mild disease elsewhere. 3.  LVEDP of 8 mmHg. Recommendation: Dual antiplatelet therapy for at least 6 months and preferably 1 year with aspirin  and Brilinta  followed by indefinite Brilinta  monotherapy.  Judicious IV fluid resuscitation.  The results were reviewed with the patient's mother.   ECHOCARDIOGRAM COMPLETE Result Date: 12/07/2023    ECHOCARDIOGRAM REPORT   Patient Name:   HARLIS CHAMPOUX [facility] Date of Exam: 12/07/2023 Medical Rec #:  [id number]          Height:       72.0 in Accession #:    [id number]         Weight:       175.0 lb Date of Birth:  [birth year]          BSA:          2.013 m Patient Age:    47 years           BP:           112/67 mmHg Patient Gender: M                  HR:           58 bpm. Exam Location:  Inpatient Procedure: 2D Echo, Cardiac Doppler and Color Doppler (Both Spectral and Color            Flow Doppler were utilized during procedure). Indications:    chest pain  History:        Patient has prior history of Echocardiogram examinations, most                 recent 08/15/2020.  Sonographer:    Benard Stallion Referring Phys: [id number] MICHAEL COSIANO IMPRESSIONS  1. Left ventricular ejection fraction, by estimation, is 55 to 60%. The left ventricle has normal function. The left ventricle has no regional wall motion abnormalities. Left ventricular diastolic parameters were normal.  2. Right ventricular systolic function is normal. The right ventricular size is normal.  3. The mitral valve is normal in structure. No evidence of mitral valve regurgitation. No evidence of mitral stenosis.  4. 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 The aortic valve is bicuspid. Aortic valve regurgitation is not visualized. No aortic stenosis is present.  5. The inferior vena cava is normal in  size with greater than 50% respiratory variability, suggesting right atrial pressure of 3 mmHg. Comparison(s): Prior images reviewed side by side. Left ventricle is slightly more vigorous. FINDINGS  Left Ventricle: Left ventricular ejection fraction, by estimation, is 55 to 60%. The left ventricle has normal function. The left ventricle has no regional wall motion abnormalities. The left ventricular internal cavity size was normal in size. There is  no left ventricular hypertrophy. Left ventricular diastolic parameters were normal. Right Ventricle: The right ventricular size is normal. No increase in right ventricular wall thickness. Right ventricular systolic function is normal. Left Atrium: Left atrial size was normal in size. Right Atrium: Right atrial size was normal in size. Pericardium: There is no evidence of pericardial effusion. Mitral Valve: The mitral valve is normal in structure. No evidence of mitral valve regurgitation. No evidence of mitral valve stenosis. Tricuspid Valve: The tricuspid valve is normal in structure. Tricuspid valve regurgitation is not demonstrated. No evidence of tricuspid stenosis. 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 The aortic valve is bicuspid. Aortic valve regurgitation is not visualized. No aortic stenosis is present. Aortic valve mean gradient measures 4.0 mmHg. Aortic valve peak gradient measures 7.5 mmHg. Aortic valve area, by VTI measures 2.57 cm. Pulmonic Valve: The pulmonic valve was normal in structure. Pulmonic valve regurgitation is trivial. No evidence of pulmonic stenosis. Aorta: The aortic root and ascending aorta are structurally normal, with no evidence of dilitation. Venous: The inferior vena cava is normal in size with greater than 50% respiratory variability, suggesting right atrial pressure of 3 mmHg. IAS/Shunts: The atrial septum is grossly normal.  LEFT VENTRICLE PLAX 2D LVIDd:         4.50 cm   Diastology LVIDs:         3.10  cm   LV e' medial:    8.49 cm/s LV PW:         1.00 cm   LV E/e' medial:  7.6 LV IVS:        1.00 cm   LV e' lateral:   11.10 cm/s LVOT diam:     2.30 cm   LV E/e' lateral: 5.8 LV SV:         77 LV SV Index:   38 LVOT Area:     4.15 cm  RIGHT VENTRICLE RV Basal diam:  2.90 cm RV Mid diam:    3.30 cm RV S prime:     9.03 cm/s TAPSE (M-mode): 1.6 cm LEFT ATRIUM             Index        RIGHT ATRIUM           Index LA diam:        3.10 cm 1.54 cm/m   RA Area:     13.70 cm LA Vol (A2C):   29.1 ml 14.45 ml/m  RA Volume:   28.40 ml  14.11 ml/m LA Vol (A4C):   24.2 ml 12.02 ml/m LA Biplane Vol: 27.3 ml 13.56 ml/m  AORTIC VALVE AV Area (Vmax):    2.46 cm AV Area (Vmean):   2.37 cm AV Area (VTI):     2.57 cm AV Vmax:           137.00 cm/s AV Vmean:  93.700 cm/s AV VTI:            0.299 m AV Peak Grad:      7.5 mmHg AV Mean Grad:      4.0 mmHg LVOT Vmax:         81.20 cm/s LVOT Vmean:        53.400 cm/s LVOT VTI:          0.185 m LVOT/AV VTI ratio: 0.62  AORTA Ao Root diam: 3.30 cm Ao Asc diam:  3.60 cm MITRAL VALVE MV Area (PHT): 3.03 cm    SHUNTS MV Decel Time: 250 msec    Systemic VTI:  0.18 m MV E velocity: 64.90 cm/s  Systemic Diam: 2.30 cm MV A velocity: 53.40 cm/s MV E/A ratio:  1.22 Stanly Leavens MD Electronically signed by Stanly Leavens MD Signature Date/Time: 12/07/2023/2:03:58 PM    Final    DG Chest 2 View Result Date: 12/06/2023 CLINICAL DATA:  Chest pain EXAM: CHEST - 2 VIEW COMPARISON:  04/16/2020 FINDINGS: The heart size and mediastinal contours are within normal limits. Both lungs are clear. The visualized skeletal structures are unremarkable. No pneumothorax. IMPRESSION: No active cardiopulmonary disease. Electronically Signed   By: Franky Crease M.D.   On: 12/06/2023 15:29    Disposition Pt is being discharged home today in good condition.  Follow-up Plans & Appointments  Discharge Instructions     AMB Referral to Cardiac Rehabilitation - Phase II   Complete by:  As directed    Diagnosis: NSTEMI   After initial evaluation and assessments completed: Virtual Based Care may be provided alone or in conjunction with Phase 2 Cardiac Rehab based on patient barriers.: Yes   Intensive Cardiac Rehabilitation (ICR) [facility] location only OR Traditional Cardiac Rehabilitation (TCR) *If criteria for ICR are not met will enroll in TCR [facility] only): Yes       Discharge Medications Allergies as of 12/08/2023       Reactions   Penicillins Other (See Comments)   UPSET STOMACH   Vicodin [hydrocodone-acetaminophen ] Other (See Comments)   Unknown reaction         Medication List     STOP taking these medications    metoprolol  tartrate 25 MG tablet Commonly known as: LOPRESSOR        TAKE these medications    aspirin  EC 81 MG tablet Take 81 mg by mouth daily. Swallow whole.   atorvastatin  80 MG tablet Commonly known as: LIPITOR  Take 1 tablet (80 mg total) by mouth daily.   ezetimibe  10 MG tablet Commonly known as: ZETIA  Take 1 tablet (10 mg total) by mouth daily.   lisinopril  2.5 MG tablet Commonly known as: ZESTRIL  Take 1 tablet (2.5 mg total) by mouth daily.   metoprolol  succinate 25 MG 24 hr tablet Commonly known as: TOPROL -XL Take 0.5 tablets (12.5 mg total) by mouth daily.   nitroGLYCERIN  0.4 MG SL tablet Commonly known as: NITROSTAT  Place 1 tablet (0.4 mg total) under the tongue every 5 (five) minutes x 3 doses as needed for chest pain. What changed: how much to take   ticagrelor  90 MG Tabs tablet Commonly known as: BRILINTA  Take 1 tablet (90 mg total) by mouth 2 (two) times daily.   TYLENOL  PO Take 2 tablets by mouth daily as needed (pain/headaches).         Outstanding Labs/Studies  Repeat BMP at follow up  Duration of Discharge Encounter: APP Time: 25 minutes   Signed, Jon Nat Hails, PA 12/08/2023, 9:09 AM  Agree with note by Jon Hails PA-C  Patient mated with accelerated angina.  Troponins were flat.  He did  have a prior stent.  He had left heart cath yesterday with PCI and stenting of a high-grade mid dominant RCA lesion.  His LAD stent was widely patent.  His LV function is normal by 2D echo.  He is placed on DAPT with aspirin  and Brilinta .  Patient stable for discharge.  Will arrange outpatient follow-up.  I have reiterated the importance of smoking cessation and medication compliance.   Dorn DOROTHA Lesches, M.D., FACP, [facility], [facility]YN[facility] [address] [place], [place]  [address]  [phone] 12/08/2023 10:35 AM

## 2023-12-08 NOTE — Telephone Encounter (Signed)
 Pharmacy Patient Advocate Encounter  Insurance verification completed.    The patient is insured through [employer]/ [employer].     Ran test claim for Prasugrel 10mg  and the current 30 day co-pay is $10.00.   This test claim was processed through [facility]- copay amounts may vary at other pharmacies due to pharmacy/plan contracts, or as the patient moves through the different stages of their insurance plan.

## 2023-12-09 LAB — LIPOPROTEIN A (LPA): Lipoprotein (a): 224.2 nmol/L — ABNORMAL HIGH (ref <75.0)

## 2023-12-12 NOTE — Progress Notes (Unsigned)
 Cardiology Office Note    Date:  12/15/2023  ID:  [name], DOB [birth year], MRN [id number] PCP:  Patient, No Pcp Per  Cardiologist:  Ozell Fell, MD  Electrophysiologist:  None   Chief Complaint: Follow up for CAD   History of Present Illness: Martin Johnston is a 48 y.o. male with visit-pertinent history of CAD, anterolateral STEMI in 06/2020 s/p DES to LAD, hyperlipidemia, hypertension, tobacco use, insulin resistance.  On 12/03/2023 patient presented to the ED with substernal chest pressure that he reported having at rest intermittently the day prior that worsened on exertion day of arrival to ED.  Patient's EKG was unchanged, troponin 80>81.  Patient was admitted for [facility] which showed 95% stenosis in the proximal to mid RCA successfully treated with PCI/DES 3.0 x 20 mm Synergy stent.  Patient was started on aspirin  and Brilinta .  It was noted that patient had been partially noncompliant with his aspirin  and lipid-lowering medications and continued smoking.  Patient was discharged on 12/08/2023.  Today he presents for follow-up with his mother.  He reports that he is doing well, he denies chest pain, lower extremity edema, orthopnea or PND.  He notes some very mild shortness of breath when walking outside in the heat, reports it resolves very quickly.  Patient denies any palpitations, presyncope or syncope.  He reports adherence with all medications, reports that he has been tolerating well.  ROS: .   Today he denies chest pain, lower extremity edema, fatigue, palpitations, melena, hematuria, hemoptysis, diaphoresis, weakness, presyncope, syncope, orthopnea, and PND.  All other systems are reviewed and otherwise negative. Studies Reviewed: SABRA   EKG:  EKG is not ordered today.  CV Studies: Cardiac studies reviewed are outlined and summarized above. Otherwise please see EMR for full report. Cardiac Studies & Procedures    ______________________________________________________________________________________________ CARDIAC CATHETERIZATION  CARDIAC CATHETERIZATION 12/07/2023  Conclusion   Mid LAD lesion is 30% stenosed.   Prox RCA to Mid RCA lesion is 95% stenosed.   Non-stenotic Prox LAD to Mid LAD lesion was previously treated.   A stent was successfully placed.   Post intervention, there is a 0% residual stenosis.  1.  New high-grade mid right coronary artery lesion treated with 1 drug-eluting stent. 2.  Widely patent proximal LAD stent with mild disease elsewhere. 3.  LVEDP of 8 mmHg.  Recommendation: Dual antiplatelet therapy for at least 6 months and preferably 1 year with aspirin  and Brilinta  followed by indefinite Brilinta  monotherapy.  Judicious IV fluid resuscitation.  The results were reviewed with the patient's mother.  Findings Coronary Findings Diagnostic  Dominance: Right  Left Anterior Descending Non-stenotic Prox LAD to Mid LAD lesion was previously treated. The lesion is heavily thrombotic. Mid LAD lesion is 30% stenosed.  Left Circumflex Vessel is moderate in size. The vessel exhibits minimal luminal irregularities.  Right Coronary Artery Vessel is large. Vessel is angiographically normal. Large, dominant vessel with minimal irregularity Prox RCA to Mid RCA lesion is 95% stenosed.  Intervention  Prox RCA to Mid RCA lesion Stent A stent was successfully placed. Post-Intervention Lesion Assessment The intervention was successful. Pre-interventional TIMI flow is 3. Post-intervention TIMI flow is 3. There is a 0% residual stenosis post intervention.   CARDIAC CATHETERIZATION  CARDIAC CATHETERIZATION 06/19/2020  Conclusion  Prox LAD to Mid LAD lesion is 70% stenosed.  Mid LAD lesion is 50% stenosed.  A drug-eluting stent was successfully placed using a STENT RESOLUTE ONYX 3.5X15.  Post intervention,  there is a 0% residual stenosis.  1.  Severe single-vessel coronary  artery disease with thrombotic proximal LAD lesion, treated successfully with direct stenting using a 3.5 x 15 mm resolute Onyx DES, postdilated with a 3.75 mm noncompliant balloon with intravascular ultrasound guidance 2.  Widely patent left mainstem, left circumflex, and RCA with mild nonobstructive plaquing noted 3.  Moderate nonobstructive mid LAD stenosis after the second diagonal branch, appropriate for medical therapy  Recommendations: Aggrastat  x6 hours Patient loaded with aspirin  324 mg and ticagrelor  180 mg in the Cath Lab 2D echocardiogram Medical therapy and tobacco cessation Hospital discharge 24 to 48 hours depending on assessment of LV function and post MI clinical course.  Findings Coronary Findings Diagnostic  Dominance: Right  Left Anterior Descending Prox LAD to Mid LAD lesion is 70% stenosed. The lesion is heavily thrombotic. Mid LAD lesion is 50% stenosed.  Left Circumflex Vessel is moderate in size. The vessel exhibits minimal luminal irregularities.  Right Coronary Artery Vessel is large. The vessel exhibits minimal luminal irregularities. Large, dominant vessel with minimal irregularity  Intervention  Prox LAD to Mid LAD lesion Stent CATH LAUNCHER 6FR EBU3.5 guide catheter was inserted. Lesion crossed with guidewire using a WIRE COUGAR XT STRL 190CM. Pre-stent angioplasty was not performed. A drug-eluting stent was successfully placed using a STENT RESOLUTE ONYX 3.5X15. Post-stent angioplasty was performed using a BALLOON SAPPHIRE [place] [id number]. Maximum pressure:  16 atm. Post-Intervention Lesion Assessment The intervention was successful. Pre-interventional TIMI flow is 3. Post-intervention TIMI flow is 3. No complications occurred at this lesion. Ultrasound (IVUS) was performed on the lesion post PCI. Stent well expanded. Moderate plaque burden detected. Lesion characteristics:  calcified and eccentric. An additional ultrasound (IVUS) was not performed. There is  a 0% residual stenosis post intervention.     ECHOCARDIOGRAM  ECHOCARDIOGRAM COMPLETE 12/07/2023  Narrative ECHOCARDIOGRAM REPORT    Patient Name:   Martin Johnston [facility] Date of Exam: 12/07/2023 Medical Rec #:  [id number]          Height:       72.0 in Accession #:    [id number]         Weight:       175.0 lb Date of Birth:  06-01-1976          BSA:          2.013 m Patient Age:    47 years           BP:           112/67 mmHg Patient Gender: M                  HR:           58 bpm. Exam Location:  Inpatient  Procedure: 2D Echo, Cardiac Doppler and Color Doppler (Both Spectral and Color Flow Doppler were utilized during procedure).  Indications:    chest pain  History:        Patient has prior history of Echocardiogram examinations, most recent 08/15/2020.  Sonographer:    Benard Stallion Referring Phys: [id number] MICHAEL COSIANO  IMPRESSIONS   1. Left ventricular ejection fraction, by estimation, is 55 to 60%. The left ventricle has normal function. The left ventricle has no regional wall motion abnormalities. Left ventricular diastolic parameters were normal. 2. Right ventricular systolic function is normal. The right ventricular size is normal. 3. The mitral valve is normal in structure. No evidence of mitral valve regurgitation. No evidence of mitral stenosis. 4. 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 The aortic valve is bicuspid. Aortic valve regurgitation is not visualized. No aortic stenosis is present. 5. The inferior vena cava is normal in size with greater than 50% respiratory variability, suggesting right atrial pressure of 3 mmHg.  Comparison(s): Prior images reviewed side by side. Left ventricle is slightly more vigorous.  FINDINGS Left Ventricle: Left ventricular ejection fraction, by estimation, is 55 to 60%. The left ventricle has normal function. The left ventricle has no regional wall motion abnormalities. The left ventricular internal cavity size  was normal in size. There is no left ventricular hypertrophy. Left ventricular diastolic parameters were normal.  Right Ventricle: The right ventricular size is normal. No increase in right ventricular wall thickness. Right ventricular systolic function is normal.  Left Atrium: Left atrial size was normal in size.  Right Atrium: Right atrial size was normal in size.  Pericardium: There is no evidence of pericardial effusion.  Mitral Valve: The mitral valve is normal in structure. No evidence of mitral valve regurgitation. No evidence of mitral valve stenosis.  Tricuspid Valve: The tricuspid valve is normal in structure. Tricuspid valve regurgitation is not demonstrated. No evidence of tricuspid stenosis.  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 The aortic valve is bicuspid. Aortic valve regurgitation is not visualized. No aortic stenosis is present. Aortic valve mean gradient measures 4.0 mmHg. Aortic valve peak gradient measures 7.5 mmHg. Aortic valve area, by VTI measures 2.57 cm.  Pulmonic Valve: The pulmonic valve was normal in structure. Pulmonic valve regurgitation is trivial. No evidence of pulmonic stenosis.  Aorta: The aortic root and ascending aorta are structurally normal, with no evidence of dilitation.  Venous: The inferior vena cava is normal in size with greater than 50% respiratory variability, suggesting right atrial pressure of 3 mmHg.  IAS/Shunts: The atrial septum is grossly normal.   LEFT VENTRICLE PLAX 2D LVIDd:         4.50 cm   Diastology LVIDs:         3.10 cm   LV e' medial:    8.49 cm/s LV PW:         1.00 cm   LV E/e' medial:  7.6 LV IVS:        1.00 cm   LV e' lateral:   11.10 cm/s LVOT diam:     2.30 cm   LV E/e' lateral: 5.8 LV SV:         77 LV SV Index:   38 LVOT Area:     4.15 cm   RIGHT VENTRICLE RV Basal diam:  2.90 cm RV Mid diam:    3.30 cm RV S prime:     9.03 cm/s TAPSE (M-mode): 1.6 cm  LEFT ATRIUM              Index        RIGHT ATRIUM           Index LA diam:        3.10 cm 1.54 cm/m   RA Area:     13.70 cm LA Vol (A2C):   29.1 ml 14.45 ml/m  RA Volume:   28.40 ml  14.11 ml/m LA Vol (A4C):   24.2 ml 12.02 ml/m LA Biplane Vol: 27.3 ml 13.56 ml/m AORTIC VALVE AV Area (Vmax):    2.46 cm AV Area (Vmean):   2.37 cm AV Area (VTI):     2.57 cm AV Vmax:  137.00 cm/s AV Vmean:          93.700 cm/s AV VTI:            0.299 m AV Peak Grad:      7.5 mmHg AV Mean Grad:      4.0 mmHg LVOT Vmax:         81.20 cm/s LVOT Vmean:        53.400 cm/s LVOT VTI:          0.185 m LVOT/AV VTI ratio: 0.62  AORTA Ao Root diam: 3.30 cm Ao Asc diam:  3.60 cm  MITRAL VALVE MV Area (PHT): 3.03 cm    SHUNTS MV Decel Time: 250 msec    Systemic VTI:  0.18 m MV E velocity: 64.90 cm/s  Systemic Diam: 2.30 cm MV A velocity: 53.40 cm/s MV E/A ratio:  1.22  Stanly Leavens MD Electronically signed by Stanly Leavens MD Signature Date/Time: 12/07/2023/2:03:58 PM    Final          ______________________________________________________________________________________________       Current Reported Medications:.    Current Meds  Medication Sig   Acetaminophen  (TYLENOL  PO) Take 2 tablets by mouth daily as needed (pain/headaches).   aspirin  EC 81 MG tablet Take 1 tablet (81 mg total) by mouth daily. Swallow whole.   [DISCONTINUED] atorvastatin  (LIPITOR ) 80 MG tablet Take 1 tablet (80 mg total) by mouth daily.   [DISCONTINUED] ezetimibe  (ZETIA ) 10 MG tablet Take 1 tablet (10 mg total) by mouth daily.   [DISCONTINUED] lisinopril  (ZESTRIL ) 2.5 MG tablet Take 1 tablet (2.5 mg total) by mouth daily.   [DISCONTINUED] metoprolol  succinate (TOPROL -XL) 25 MG 24 hr tablet Take 0.5 tablets (12.5 mg total) by mouth daily. (Patient taking differently: Take 25 mg by mouth daily.)   [DISCONTINUED] nitroGLYCERIN  (NITROSTAT ) 0.4 MG SL tablet Place 1 tablet (0.4 mg total) under the tongue  every 5 (five) minutes x 3 doses as needed for chest pain.   [DISCONTINUED] ticagrelor  (BRILINTA ) 90 MG TABS tablet Take 1 tablet (90 mg total) by mouth 2 (two) times daily.    Physical Exam:    VS:  BP 109/69   Pulse 84   Ht 6' (1.829 m)   Wt 181 lb 6.4 oz (82.3 kg)   SpO2 96%   BMI 24.60 kg/m    Wt Readings from Last 3 Encounters:  12/14/23 181 lb 6.4 oz (82.3 kg)  12/07/23 178 lb 5.6 oz (80.9 kg)  11/11/22 174 lb (78.9 kg)    GEN: Well nourished, well developed in no acute distress NECK: No JVD; No carotid bruits CARDIAC: RRR, no murmurs, rubs, gallops, right radial cath site clean and intact without evidence of hematoma RESPIRATORY:  Clear to auscultation without rales, wheezing or rhonchi  ABDOMEN: Soft, non-tender, non-distended EXTREMITIES:  No edema; No acute deformity     Asessement and Plan:.    CAD: Patient with prior stenting to LAD in 2022 in setting of a STEMI.  LHC on 12/07/2023 in setting of NSTEMI demonstrated 95% stenosis in the proximal to mid RCA successfully treated with PCI/DES 3.0 x 20 mm Synergy stent.  He was started on aspirin  and Brilinta  for 12 months followed by Brilinta  indefinitely. Stable with no anginal symptoms.  He denies any chest pain.  Reports adherence with medication.  Right radial cath site clean and intact without evidence of hematoma.  Patient reports that he is not interested in cardiac rehab.  Continue aspirin  81 mg daily, Lipitor  80 mg daily,  Zetia  10 mg daily, lisinopril  2.5 mg daily, metoprolol  succinate 25 mg daily and Brilinta  90 mg twice daily.  Refills provided.  Reviewed ED precautions.  Hypertension: Blood pressure today 109/69. Continue current antihypertensive regimen.  Hyperlipidemia: Lipid profile 12/03/2023 indicated total cholesterol 137, HDL 25, LDL 75 and triglycerides 187.  LDL goal less than 55 given history of CAD and smoking history.  Restarted Lipitor  80 mg daily and Zetia  10 mg daily, prior to admission was not taking  his medications.  Check fasting lipid profile and LFTs in 6 weeks.  Ischemic cardiomyopathy: Echo at time of STEMI in 2022 showed LVEF 40 to 45%, improved to 50% on follow-up echo.  LVEF remained preserved during admission in setting of NSTEMI. Continue metoprolol  succinate 25 mg daily and lisinopril  2.5 mg daily.  Tobacco use: Patient reports that he is currently smoking, although unable to report current mental he is smoking.  Information provided on 1 800 quit NOW and patient has been referred to a new primary care physician.  Bicuspid aortic valve: Noted on echocardiogram, no AAS.  No evidence of dilated root or ascending aorta.  Will plan to repeat echocardiogram in 1 year.    Cardiac Rehabilitation Eligibility Assessment  The patient has declined or is not appropriate for cardiac rehabilitation.    Disposition: F/u with Dr. Wonda or Shaleah Nissley, NP in 3 months.   Signed, Thaddius Manes D Esha Fincher, NP

## 2023-12-13 ENCOUNTER — Encounter (HOSPITAL_BASED_OUTPATIENT_CLINIC_OR_DEPARTMENT_OTHER): Payer: Self-pay

## 2023-12-14 ENCOUNTER — Ambulatory Visit: Attending: Cardiology | Admitting: Cardiology

## 2023-12-14 ENCOUNTER — Encounter: Payer: Self-pay | Admitting: Cardiology

## 2023-12-14 VITALS — BP 109/69 | HR 84 | Ht 72.0 in | Wt 181.4 lb

## 2023-12-14 DIAGNOSIS — I255 Ischemic cardiomyopathy: Secondary | ICD-10-CM

## 2023-12-14 DIAGNOSIS — I10 Essential (primary) hypertension: Secondary | ICD-10-CM

## 2023-12-14 DIAGNOSIS — I2510 Atherosclerotic heart disease of native coronary artery without angina pectoris: Secondary | ICD-10-CM

## 2023-12-14 DIAGNOSIS — E782 Mixed hyperlipidemia: Secondary | ICD-10-CM

## 2023-12-14 DIAGNOSIS — Q2381 Bicuspid aortic valve: Secondary | ICD-10-CM

## 2023-12-14 DIAGNOSIS — Z720 Tobacco use: Secondary | ICD-10-CM

## 2023-12-14 MED ORDER — LISINOPRIL 2.5 MG PO TABS: 2.5000 mg | ORAL_TABLET | Freq: Every day | ORAL | 3 refills | Status: DC

## 2023-12-14 MED ORDER — EZETIMIBE 10 MG PO TABS: 10.0000 mg | ORAL_TABLET | Freq: Every day | ORAL | 3 refills | Status: AC

## 2023-12-14 MED ORDER — ATORVASTATIN CALCIUM 80 MG PO TABS: 80.0000 mg | ORAL_TABLET | Freq: Every day | ORAL | 3 refills | Status: AC

## 2023-12-14 MED ORDER — NITROGLYCERIN 0.4 MG SL SUBL
0.4000 mg | SUBLINGUAL_TABLET | SUBLINGUAL | 3 refills | Status: AC | PRN
Start: 2023-12-14 — End: ?

## 2023-12-14 MED ORDER — TICAGRELOR 90 MG PO TABS
90.0000 mg | ORAL_TABLET | Freq: Two times a day (BID) | ORAL | 3 refills | Status: AC
Start: 2023-12-14 — End: ?

## 2023-12-14 MED ORDER — METOPROLOL SUCCINATE ER 25 MG PO TB24: 25.0000 mg | ORAL_TABLET | Freq: Every day | ORAL | 3 refills | Status: AC

## 2023-12-14 NOTE — Patient Instructions (Signed)
 Medication Instructions:  No changes *If you need a refill on your cardiac medications before your next appointment, please call your pharmacy*  Lab Work: Today we are going to draw a Bmet In 2 weeks we are going to have fasting labs If you have labs (blood work) drawn today and your tests are completely normal, you will receive your results only by: MyChart Message (if you have MyChart) OR A paper copy in the mail If you have any lab test that is abnormal or we need to change your treatment, we will call you to review the results.  Testing/Procedures: No testing  Follow-Up: At [facility], you and your health needs are our priority.  As part of our continuing mission to provide you with exceptional heart care, our providers are all part of one team.  This team includes your primary Cardiologist (physician) and Advanced Practice Providers or APPs (Physician Assistants and Nurse Practitioners) who all work together to provide you with the care you need, when you need it.  Your next appointment:   3 month(s)  Provider:   Ozell Fell, MD or Katlyn West, NP   We recommend signing up for the patient portal called MyChart.  Sign up information is provided on this After Visit Summary.  MyChart is used to connect with patients for Virtual Visits (Telemedicine).  Patients are able to view lab/test results, encounter notes, upcoming appointments, etc.  Non-urgent messages can be sent to your provider as well.   To learn more about what you can do with MyChart, go to [email or web address].   Other Instructions If intrusted in quitting smoking reach out to 1-800-QUIT smoking

## 2023-12-15 ENCOUNTER — Ambulatory Visit: Payer: Self-pay | Admitting: Cardiology

## 2023-12-15 ENCOUNTER — Encounter: Payer: Self-pay | Admitting: Cardiology

## 2023-12-15 LAB — BASIC METABOLIC PANEL WITH GFR
BUN/Creatinine Ratio: 14 (ref 9–20)
BUN: 15 mg/dL (ref 6–24)
CO2: 21 mmol/L (ref 20–29)
Calcium: 10.6 mg/dL — ABNORMAL HIGH (ref 8.7–10.2)
Chloride: 99 mmol/L (ref 96–106)
Creatinine, Ser: 1.05 mg/dL (ref 0.76–1.27)
Glucose: 95 mg/dL (ref 70–99)
Potassium: 4.6 mmol/L (ref 3.5–5.2)
Sodium: 141 mmol/L (ref 134–144)
eGFR: 87 mL/min/{1.73_m2} (ref >59)

## 2023-12-28 DIAGNOSIS — E782 Mixed hyperlipidemia: Secondary | ICD-10-CM | POA: Diagnosis not present

## 2023-12-28 DIAGNOSIS — I2510 Atherosclerotic heart disease of native coronary artery without angina pectoris: Secondary | ICD-10-CM | POA: Diagnosis not present

## 2023-12-28 DIAGNOSIS — I255 Ischemic cardiomyopathy: Secondary | ICD-10-CM | POA: Diagnosis not present

## 2023-12-28 DIAGNOSIS — I10 Essential (primary) hypertension: Secondary | ICD-10-CM | POA: Diagnosis not present

## 2023-12-29 LAB — LIPID PANEL
Chol/HDL Ratio: 3.5 ratio (ref 0.0–5.0)
Cholesterol, Total: 98 mg/dL — ABNORMAL LOW (ref 100–199)
HDL: 28 mg/dL — ABNORMAL LOW (ref >39)
LDL Chol Calc (NIH): 50 mg/dL (ref 0–99)
Triglycerides: 102 mg/dL (ref 0–149)
VLDL Cholesterol Cal: 20 mg/dL (ref 5–40)

## 2023-12-29 LAB — HEPATIC FUNCTION PANEL
ALT: 44 IU/L (ref 0–44)
AST: 30 IU/L (ref 0–40)
Albumin: 4.7 g/dL (ref 4.1–5.1)
Alkaline Phosphatase: 142 IU/L — ABNORMAL HIGH (ref 44–121)
Bilirubin Total: 0.8 mg/dL (ref 0.0–1.2)
Bilirubin, Direct: 0.27 mg/dL (ref 0.00–0.40)
Total Protein: 7.3 g/dL (ref 6.0–8.5)

## 2023-12-29 NOTE — Telephone Encounter (Signed)
 Called patient advised of below they verbalized understanding.

## 2023-12-29 NOTE — Telephone Encounter (Signed)
-----   Message from Katlyn D West sent at 12/29/2023 10:43 AM EDT ----- Please let Martin Johnston know that his LFTs are normal and his cholesterol is well controlled. Good results, continue current medications.  ----- Message ----- From: Interface, [facility] Lab Results In Sent: 12/15/2023   2:36 AM EDT To: Katlyn D West, NP

## 2024-01-22 ENCOUNTER — Other Ambulatory Visit (HOSPITAL_BASED_OUTPATIENT_CLINIC_OR_DEPARTMENT_OTHER): Payer: Self-pay

## 2024-01-22 ENCOUNTER — Other Ambulatory Visit: Payer: Self-pay | Admitting: Physician Assistant

## 2024-01-25 ENCOUNTER — Other Ambulatory Visit (HOSPITAL_BASED_OUTPATIENT_CLINIC_OR_DEPARTMENT_OTHER): Payer: Self-pay

## 2024-03-12 NOTE — Progress Notes (Unsigned)
 Cardiology Office Note    Date:  03/12/2024  ID:  BENJAMIM HARNISH, DOB 26-Jan-1976, MRN [id number] PCP:  Patient, No Pcp Per  Cardiologist:  Ozell Fell, MD  Electrophysiologist:  None   Chief Complaint: ***  History of Present Illness: Martin Johnston is a 48 y.o. male with visit-pertinent history of CAD, anterolateral STEMI in 06/2020 s/p DES to LAD, hyperlipidemia, hypertension, tobacco use, insulin resistance.   On 12/03/2023 patient presented to the ED with substernal chest pressure that he reported having at rest intermittently the day prior that worsened on exertion day of arrival to ED.  Patient's EKG was unchanged, troponin 80>81.  Patient was admitted for [facility] which showed 95% stenosis in the proximal to mid RCA successfully treated with PCI/DES 3.0 x 20 mm Synergy stent.  Patient was started on aspirin  and Brilinta .  It was noted that patient had been partially noncompliant with his aspirin  and lipid-lowering medications and continued smoking.  Patient was discharged on 12/08/2023.  Patient was last seen in clinic on 12/14/2023.  He reported that he is doing very well, denied any further chest pain, lower extremity edema, orthopnea or PND.  He notes some very mild shortness of breath when walking outside in the heat, resolves quickly.  Today he presents for follow-up.  He reports that he  CAD: Patient with prior stenting to LAD in 2022 in setting of a STEMI.  LHC on 12/07/2023 in setting of NSTEMI demonstrated 95% stenosis in the proximal to mid RCA successfully treated with PCI/DES 3.0 x 20 mm Synergy stent.  He was started on aspirin  and Brilinta  for 12 months followed by Brilinta  indefinitely.  Today he reports  Continue  Reviewed ED precautions.  Hypertension: Blood pressure today   Hyperlipidemia: Last lipid profile on   Ischemic cardiomyopathy: Echo at time of STEMI in 2022 showed LVEF 40 to 45%, improved to 50% on follow-up echo.  LVEF remained preserved during  admission in setting of NSTEMI. Continue metoprolol  succinate 25 mg daily and lisinopril  2.5 mg daily.  Tobacco use:   Bicuspid aortic valve: Noted on echocardiogram, no AAS.  No evidence of dilated root or ascending aorta.  Will plan to repeat echocardiogram in 1 year for ongoing monitoring.     Labwork independently reviewed:   ROS: .   *** denies chest pain, shortness of breath, lower extremity edema, fatigue, palpitations, melena, hematuria, hemoptysis, diaphoresis, weakness, presyncope, syncope, orthopnea, and PND.  All other systems are reviewed and otherwise negative.  Studies Reviewed: SABRA    EKG:  EKG is ordered today, personally reviewed, demonstrating ***     CV Studies: Cardiac studies reviewed are outlined and summarized above. Otherwise please see EMR for full report. Cardiac Studies & Procedures   ______________________________________________________________________________________________ CARDIAC CATHETERIZATION  CARDIAC CATHETERIZATION 12/07/2023  Conclusion   Mid LAD lesion is 30% stenosed.   Prox RCA to Mid RCA lesion is 95% stenosed.   Non-stenotic Prox LAD to Mid LAD lesion was previously treated.   A stent was successfully placed.   Post intervention, there is a 0% residual stenosis.  1.  New high-grade mid right coronary artery lesion treated with 1 drug-eluting stent. 2.  Widely patent proximal LAD stent with mild disease elsewhere. 3.  LVEDP of 8 mmHg.  Recommendation: Dual antiplatelet therapy for at least 6 months and preferably 1 year with aspirin  and Brilinta  followed by indefinite Brilinta  monotherapy.  Judicious IV fluid resuscitation.  The results were reviewed with  the patient's mother.  Findings Coronary Findings Diagnostic  Dominance: Right  Left Anterior Descending Non-stenotic Prox LAD to Mid LAD lesion was previously treated. The lesion is heavily thrombotic. Mid LAD lesion is 30% stenosed.  Left Circumflex Vessel is moderate in size.  The vessel exhibits minimal luminal irregularities.  Right Coronary Artery Vessel is large. Vessel is angiographically normal. Large, dominant vessel with minimal irregularity Prox RCA to Mid RCA lesion is 95% stenosed.  Intervention  Prox RCA to Mid RCA lesion Stent A stent was successfully placed. Post-Intervention Lesion Assessment The intervention was successful. Pre-interventional TIMI flow is 3. Post-intervention TIMI flow is 3. There is a 0% residual stenosis post intervention.   CARDIAC CATHETERIZATION  CARDIAC CATHETERIZATION 06/19/2020  Conclusion  Prox LAD to Mid LAD lesion is 70% stenosed.  Mid LAD lesion is 50% stenosed.  A drug-eluting stent was successfully placed using a STENT RESOLUTE ONYX 3.5X15.  Post intervention, there is a 0% residual stenosis.  1.  Severe single-vessel coronary artery disease with thrombotic proximal LAD lesion, treated successfully with direct stenting using a 3.5 x 15 mm resolute Onyx DES, postdilated with a 3.75 mm noncompliant balloon with intravascular ultrasound guidance 2.  Widely patent left mainstem, left circumflex, and RCA with mild nonobstructive plaquing noted 3.  Moderate nonobstructive mid LAD stenosis after the second diagonal branch, appropriate for medical therapy  Recommendations: Aggrastat  x6 hours Patient loaded with aspirin  324 mg and ticagrelor  180 mg in the Cath Lab 2D echocardiogram Medical therapy and tobacco cessation Hospital discharge 24 to 48 hours depending on assessment of LV function and post MI clinical course.  Findings Coronary Findings Diagnostic  Dominance: Right  Left Anterior Descending Prox LAD to Mid LAD lesion is 70% stenosed. The lesion is heavily thrombotic. Mid LAD lesion is 50% stenosed.  Left Circumflex Vessel is moderate in size. The vessel exhibits minimal luminal irregularities.  Right Coronary Artery Vessel is large. The vessel exhibits minimal luminal irregularities. Large,  dominant vessel with minimal irregularity  Intervention  Prox LAD to Mid LAD lesion Stent CATH LAUNCHER 6FR EBU3.5 guide catheter was inserted. Lesion crossed with guidewire using a WIRE COUGAR XT STRL 190CM. Pre-stent angioplasty was not performed. A drug-eluting stent was successfully placed using a STENT RESOLUTE ONYX 3.5X15. Post-stent angioplasty was performed using a BALLOON SAPPHIRE [place] [id number]. Maximum pressure:  16 atm. Post-Intervention Lesion Assessment The intervention was successful. Pre-interventional TIMI flow is 3. Post-intervention TIMI flow is 3. No complications occurred at this lesion. Ultrasound (IVUS) was performed on the lesion post PCI. Stent well expanded. Moderate plaque burden detected. Lesion characteristics:  calcified and eccentric. An additional ultrasound (IVUS) was not performed. There is a 0% residual stenosis post intervention.     ECHOCARDIOGRAM  ECHOCARDIOGRAM COMPLETE 12/07/2023  Narrative ECHOCARDIOGRAM REPORT    Patient Name:   SAYRE WITHERINGTON [facility] Date of Exam: 12/07/2023 Medical Rec #:  [id number]          Height:       72.0 in Accession #:    [id number]         Weight:       175.0 lb Date of Birth:  1975-12-06          BSA:          2.013 m Patient Age:    47 years           BP:           112/67 mmHg Patient Gender: M  HR:           58 bpm. Exam Location:  Inpatient  Procedure: 2D Echo, Cardiac Doppler and Color Doppler (Both Spectral and Color Flow Doppler were utilized during procedure).  Indications:    chest pain  History:        Patient has prior history of Echocardiogram examinations, most recent 08/15/2020.  Sonographer:    Benard Stallion Referring Phys: [id number] MICHAEL COSIANO  IMPRESSIONS   1. Left ventricular ejection fraction, by estimation, is 55 to 60%. The left ventricle has normal function. The left ventricle has no regional wall motion abnormalities. Left ventricular diastolic parameters were normal. 2.  Right ventricular systolic function is normal. The right ventricular size is normal. 3. The mitral valve is normal in structure. No evidence of mitral valve regurgitation. No evidence of mitral stenosis. 4. 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 The aortic valve is bicuspid. Aortic valve regurgitation is not visualized. No aortic stenosis is present. 5. The inferior vena cava is normal in size with greater than 50% respiratory variability, suggesting right atrial pressure of 3 mmHg.  Comparison(s): Prior images reviewed side by side. Left ventricle is slightly more vigorous.  FINDINGS Left Ventricle: Left ventricular ejection fraction, by estimation, is 55 to 60%. The left ventricle has normal function. The left ventricle has no regional wall motion abnormalities. The left ventricular internal cavity size was normal in size. There is no left ventricular hypertrophy. Left ventricular diastolic parameters were normal.  Right Ventricle: The right ventricular size is normal. No increase in right ventricular wall thickness. Right ventricular systolic function is normal.  Left Atrium: Left atrial size was normal in size.  Right Atrium: Right atrial size was normal in size.  Pericardium: There is no evidence of pericardial effusion.  Mitral Valve: The mitral valve is normal in structure. No evidence of mitral valve regurgitation. No evidence of mitral valve stenosis.  Tricuspid Valve: The tricuspid valve is normal in structure. Tricuspid valve regurgitation is not demonstrated. No evidence of tricuspid stenosis.  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 The aortic valve is bicuspid. Aortic valve regurgitation is not visualized. No aortic stenosis is present. Aortic valve mean gradient measures 4.0 mmHg. Aortic valve peak gradient measures 7.5 mmHg. Aortic valve area, by VTI measures 2.57 cm.  Pulmonic Valve: The pulmonic valve was normal in structure.  Pulmonic valve regurgitation is trivial. No evidence of pulmonic stenosis.  Aorta: The aortic root and ascending aorta are structurally normal, with no evidence of dilitation.  Venous: The inferior vena cava is normal in size with greater than 50% respiratory variability, suggesting right atrial pressure of 3 mmHg.  IAS/Shunts: The atrial septum is grossly normal.   LEFT VENTRICLE PLAX 2D LVIDd:         4.50 cm   Diastology LVIDs:         3.10 cm   LV e' medial:    8.49 cm/s LV PW:         1.00 cm   LV E/e' medial:  7.6 LV IVS:        1.00 cm   LV e' lateral:   11.10 cm/s LVOT diam:     2.30 cm   LV E/e' lateral: 5.8 LV SV:         77 LV SV Index:   38 LVOT Area:     4.15 cm   RIGHT VENTRICLE RV Basal diam:  2.90 cm RV Mid diam:    3.30  cm RV S prime:     9.03 cm/s TAPSE (M-mode): 1.6 cm  LEFT ATRIUM             Index        RIGHT ATRIUM           Index LA diam:        3.10 cm 1.54 cm/m   RA Area:     13.70 cm LA Vol (A2C):   29.1 ml 14.45 ml/m  RA Volume:   28.40 ml  14.11 ml/m LA Vol (A4C):   24.2 ml 12.02 ml/m LA Biplane Vol: 27.3 ml 13.56 ml/m AORTIC VALVE AV Area (Vmax):    2.46 cm AV Area (Vmean):   2.37 cm AV Area (VTI):     2.57 cm AV Vmax:           137.00 cm/s AV Vmean:          93.700 cm/s AV VTI:            0.299 m AV Peak Grad:      7.5 mmHg AV Mean Grad:      4.0 mmHg LVOT Vmax:         81.20 cm/s LVOT Vmean:        53.400 cm/s LVOT VTI:          0.185 m LVOT/AV VTI ratio: 0.62  AORTA Ao Root diam: 3.30 cm Ao Asc diam:  3.60 cm  MITRAL VALVE MV Area (PHT): 3.03 cm    SHUNTS MV Decel Time: 250 msec    Systemic VTI:  0.18 m MV E velocity: 64.90 cm/s  Systemic Diam: 2.30 cm MV A velocity: 53.40 cm/s MV E/A ratio:  1.22  Stanly Leavens MD Electronically signed by Stanly Leavens MD Signature Date/Time: 12/07/2023/2:03:58 PM    Final           ______________________________________________________________________________________________       Current Reported Medications:.    No outpatient medications have been marked as taking for the 03/15/24 encounter (Appointment) with Hyden Soley D, NP.    Physical Exam:    VS:  There were no vitals taken for this visit.   Wt Readings from Last 3 Encounters:  12/14/23 181 lb 6.4 oz (82.3 kg)  12/07/23 178 lb 5.6 oz (80.9 kg)  11/11/22 174 lb (78.9 kg)    GEN: Well nourished, well developed in no acute distress NECK: No JVD; No carotid bruits CARDIAC: ***RRR, no murmurs, rubs, gallops RESPIRATORY:  Clear to auscultation without rales, wheezing or rhonchi  ABDOMEN: Soft, non-tender, non-distended EXTREMITIES:  No edema; No acute deformity     Asessement and Plan:.     ***     Disposition: F/u with ***  Signed, Sumedh Shinsato D Rasaan Brotherton, NP

## 2024-03-15 ENCOUNTER — Ambulatory Visit: Attending: Internal Medicine | Admitting: Cardiology

## 2024-03-15 ENCOUNTER — Encounter: Payer: Self-pay | Admitting: Cardiology

## 2024-03-15 VITALS — BP 108/68 | HR 76 | Ht 71.0 in | Wt 178.2 lb

## 2024-03-15 DIAGNOSIS — Q2381 Bicuspid aortic valve: Secondary | ICD-10-CM

## 2024-03-15 DIAGNOSIS — Z720 Tobacco use: Secondary | ICD-10-CM

## 2024-03-15 DIAGNOSIS — I2510 Atherosclerotic heart disease of native coronary artery without angina pectoris: Secondary | ICD-10-CM

## 2024-03-15 DIAGNOSIS — I10 Essential (primary) hypertension: Secondary | ICD-10-CM | POA: Diagnosis not present

## 2024-03-15 DIAGNOSIS — E782 Mixed hyperlipidemia: Secondary | ICD-10-CM

## 2024-03-15 DIAGNOSIS — I255 Ischemic cardiomyopathy: Secondary | ICD-10-CM

## 2024-03-15 NOTE — Patient Instructions (Signed)
 Medication Instructions:   Your physician recommends that you continue on your current medications as directed. Please refer to the Current Medication list given to you today.  *If you need a refill on your cardiac medications before your next appointment, please call your pharmacy*    Follow-Up: At [facility], you and your health needs are our priority.  As part of our continuing mission to provide you with exceptional heart care, our providers are all part of one team.  This team includes your primary Cardiologist (physician) and Advanced Practice Providers or APPs (Physician Assistants and Nurse Practitioners) who all work together to provide you with the care you need, when you need it.  Your next appointment:   6 month(s)  Provider:   Ozell Fell, MD

## 2024-06-28 ENCOUNTER — Ambulatory Visit: Admitting: Internal Medicine

## 2024-06-28 ENCOUNTER — Encounter: Payer: Self-pay | Admitting: Internal Medicine

## 2024-06-28 ENCOUNTER — Ambulatory Visit: Payer: Self-pay | Admitting: Internal Medicine

## 2024-06-28 VITALS — BP 104/64 | HR 72 | Temp 97.5°F | Ht 71.0 in | Wt 185.4 lb

## 2024-06-28 DIAGNOSIS — Z1159 Encounter for screening for other viral diseases: Secondary | ICD-10-CM | POA: Insufficient documentation

## 2024-06-28 DIAGNOSIS — Z0000 Encounter for general adult medical examination without abnormal findings: Secondary | ICD-10-CM | POA: Diagnosis not present

## 2024-06-28 DIAGNOSIS — E785 Hyperlipidemia, unspecified: Secondary | ICD-10-CM | POA: Diagnosis not present

## 2024-06-28 DIAGNOSIS — Z125 Encounter for screening for malignant neoplasm of prostate: Secondary | ICD-10-CM | POA: Diagnosis not present

## 2024-06-28 DIAGNOSIS — I950 Idiopathic hypotension: Secondary | ICD-10-CM

## 2024-06-28 DIAGNOSIS — I255 Ischemic cardiomyopathy: Secondary | ICD-10-CM | POA: Diagnosis not present

## 2024-06-28 DIAGNOSIS — Z1211 Encounter for screening for malignant neoplasm of colon: Secondary | ICD-10-CM | POA: Insufficient documentation

## 2024-06-28 DIAGNOSIS — E039 Hypothyroidism, unspecified: Secondary | ICD-10-CM | POA: Diagnosis not present

## 2024-06-28 DIAGNOSIS — Z0001 Encounter for general adult medical examination with abnormal findings: Secondary | ICD-10-CM | POA: Insufficient documentation

## 2024-06-28 DIAGNOSIS — Z720 Tobacco use: Secondary | ICD-10-CM

## 2024-06-28 LAB — URINALYSIS, ROUTINE W REFLEX MICROSCOPIC
Bilirubin Urine: NEGATIVE
Hgb urine dipstick: NEGATIVE
Ketones, ur: NEGATIVE
Leukocytes,Ua: NEGATIVE
Nitrite: NEGATIVE
RBC / HPF: NONE SEEN
Specific Gravity, Urine: 1.02 (ref 1.000–1.030)
Total Protein, Urine: NEGATIVE
Urine Glucose: NEGATIVE
Urobilinogen, UA: 0.2 (ref 0.0–1.0)
WBC, UA: NONE SEEN
pH: 6 (ref 5.0–8.0)

## 2024-06-28 LAB — BASIC METABOLIC PANEL WITH GFR
BUN: 16 mg/dL (ref 6–23)
CO2: 29 meq/L (ref 19–32)
Calcium: 9.7 mg/dL (ref 8.4–10.5)
Chloride: 103 meq/L (ref 96–112)
Creatinine, Ser: 1.16 mg/dL (ref 0.40–1.50)
GFR: 73.54 mL/min
Glucose, Bld: 89 mg/dL (ref 70–99)
Potassium: 4.3 meq/L (ref 3.5–5.1)
Sodium: 137 meq/L (ref 135–145)

## 2024-06-28 LAB — CBC WITH DIFFERENTIAL/PLATELET
Basophils Absolute: 0 K/uL (ref 0.0–0.1)
Basophils Relative: 0.4 % (ref 0.0–3.0)
Eosinophils Absolute: 0.2 K/uL (ref 0.0–0.7)
Eosinophils Relative: 3.2 % (ref 0.0–5.0)
HCT: 46.7 % (ref 39.0–52.0)
Hemoglobin: 16.3 g/dL (ref 13.0–17.0)
Lymphocytes Relative: 32.2 % (ref 12.0–46.0)
Lymphs Abs: 1.8 K/uL (ref 0.7–4.0)
MCHC: 34.8 g/dL (ref 30.0–36.0)
MCV: 90 fl (ref 78.0–100.0)
Monocytes Absolute: 0.6 K/uL (ref 0.1–1.0)
Monocytes Relative: 10 % (ref 3.0–12.0)
Neutro Abs: 3 K/uL (ref 1.4–7.7)
Neutrophils Relative %: 54.2 % (ref 43.0–77.0)
Platelets: 278 K/uL (ref 150.0–400.0)
RBC: 5.19 Mil/uL (ref 4.22–5.81)
RDW: 13.5 % (ref 11.5–15.5)
WBC: 5.5 K/uL (ref 4.0–10.5)

## 2024-06-28 LAB — HEPATIC FUNCTION PANEL
ALT: 29 U/L (ref 3–53)
AST: 21 U/L (ref 5–37)
Albumin: 4.7 g/dL (ref 3.5–5.2)
Alkaline Phosphatase: 102 U/L (ref 39–117)
Bilirubin, Direct: 0.1 mg/dL (ref 0.1–0.3)
Total Bilirubin: 0.7 mg/dL (ref 0.2–1.2)
Total Protein: 7 g/dL (ref 6.0–8.3)

## 2024-06-28 LAB — LIPID PANEL
Cholesterol: 92 mg/dL (ref 28–200)
HDL: 28.5 mg/dL — ABNORMAL LOW
LDL Cholesterol: 33 mg/dL (ref 10–99)
NonHDL: 63.44
Total CHOL/HDL Ratio: 3
Triglycerides: 154 mg/dL — ABNORMAL HIGH (ref 10.0–149.0)
VLDL: 30.8 mg/dL (ref 0.0–40.0)

## 2024-06-28 LAB — TSH: TSH: 10.17 u[IU]/mL — ABNORMAL HIGH (ref 0.35–5.50)

## 2024-06-28 LAB — CORTISOL: Cortisol, Plasma: 6.4 ug/dL

## 2024-06-28 LAB — PSA: PSA: 1.09 ng/mL (ref 0.10–4.00)

## 2024-06-28 MED ORDER — UNITHROID 50 MCG PO TABS: 50.0000 ug | ORAL_TABLET | Freq: Every day | ORAL | 0 refills | Status: AC

## 2024-06-28 NOTE — Progress Notes (Signed)
 "  Subjective:  Patient ID: Martin Johnston, male    DOB: 05-18-1976  Age: 49 y.o. MRN: [id number]  CC: New Patient (Initial Visit) (Establishing care. ) and Annual Exam   HPI Martin Johnston presents for a CPX and to establish ---   Discussed the use of AI scribe software for clinical note transcription with the patient, who gave verbal consent to proceed.  History of Present Illness The patient, with a history of two heart attacks, presents with episodes of weakness.  He experiences episodes of weakness, particularly after taking a break at work and eating. These episodes are not daily but occur occasionally, with the most recent episode happening yesterday. He describes feeling weak and needing to walk around to alleviate the symptoms.  He also experiences shortness of breath, particularly while working. This occurred yesterday, and he noted getting out of breath too easily. 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  He has a history of two heart attacks and is currently a smoker. He denies any alcohol consumption. His last tetanus shot was also three years ago.  No chest pain, constipation, diarrhea, blood in stool, pain or swelling in the groin, and testicular pain or swelling. No recent changes in weight or appetite.    History Martin Johnston has a past medical history of CAD S/P percutaneous coronary angioplasty, HLD (hyperlipidemia), Ischemic cardiomyopathy, and Tobacco abuse.   He has a past surgical history that includes Neck surgery; throat sugery; LEFT HEART CATH AND CORONARY ANGIOGRAPHY (N/A, 06/19/2020); Coronary Ultrasound/IVUS (N/A, 06/19/2020); Coronary/Graft Acute MI Revascularization (N/A, 06/19/2020); LEFT HEART CATH AND CORONARY ANGIOGRAPHY (N/A, 12/07/2023); and CORONARY STENT INTERVENTION (N/A, 12/07/2023).   His family history includes Cancer in his mother; Diabetes in his father; Heart disease in his father; Hypertension in his  father.He reports that he has been smoking cigarettes. He has quit using smokeless tobacco. He reports that he does not drink alcohol and does not use drugs.  Outpatient Medications Prior to Visit  Medication Sig Dispense Refill   Acetaminophen  (TYLENOL  PO) Take 2 tablets by mouth daily as needed (pain/headaches).     aspirin  EC 81 MG tablet Take 1 tablet (81 mg total) by mouth daily. Swallow whole. 120 tablet 0   atorvastatin  (LIPITOR ) 80 MG tablet Take 1 tablet (80 mg total) by mouth daily. 90 tablet 3   ezetimibe  (ZETIA ) 10 MG tablet Take 1 tablet (10 mg total) by mouth daily. 90 tablet 3   lisinopril  (ZESTRIL ) 2.5 MG tablet Take 1 tablet (2.5 mg total) by mouth daily. 90 tablet 3   metoprolol  succinate (TOPROL -XL) 25 MG 24 hr tablet Take 1 tablet (25 mg total) by mouth daily. 90 tablet 3   nitroGLYCERIN  (NITROSTAT ) 0.4 MG SL tablet Place 1 tablet (0.4 mg total) under the tongue every 5 (five) minutes x 3 doses as needed for chest pain. 25 tablet 3   ticagrelor  (BRILINTA ) 90 MG TABS tablet Take 1 tablet (90 mg total) by mouth 2 (two) times daily. 180 tablet 3   No facility-administered medications prior to visit.    ROS Review of Systems  Constitutional:  Positive for fatigue. Negative for appetite change, chills, diaphoresis and fever.  HENT: Negative.    Eyes: Negative.   Respiratory:  Negative for cough, chest tightness, shortness of breath and wheezing.   Cardiovascular:  Negative for chest pain, palpitations and leg swelling.  Gastrointestinal: Negative.  Negative for abdominal pain, constipation, diarrhea, nausea and vomiting.  Endocrine: Negative  for cold intolerance and heat intolerance.  Genitourinary: Negative.  Negative for difficulty urinating, penile swelling, scrotal swelling and testicular pain.  Musculoskeletal: Negative.  Negative for arthralgias.  Skin: Negative.   Neurological:  Positive for dizziness and weakness. Negative for syncope, light-headedness, numbness and  headaches.  Hematological:  Negative for adenopathy. Does not bruise/bleed easily.  Psychiatric/Behavioral: Negative.      Objective:  BP 104/64 (BP Location: Left Arm, Patient Position: Sitting, Cuff Size: Normal)   Pulse 72   Temp (!) 97.5 F (36.4 C) (Oral)   Ht 5' 11 (1.803 m)   Wt 185 lb 6.4 oz (84.1 kg)   SpO2 97%   BMI 25.86 kg/m   Physical Exam Vitals reviewed.  Constitutional:      General: He is not in acute distress.    Appearance: He is ill-appearing. He is not toxic-appearing or diaphoretic.  HENT:     Nose: Nose normal.     Mouth/Throat:     Mouth: Mucous membranes are moist.  Eyes:     General: No scleral icterus.    Conjunctiva/sclera: Conjunctivae normal.  Cardiovascular:     Rate and Rhythm: Normal rate and regular rhythm.     Heart sounds: No murmur heard.    No friction rub. No gallop.  Pulmonary:     Effort: Pulmonary effort is normal.     Breath sounds: No stridor. No wheezing, rhonchi or rales.  Abdominal:     General: Abdomen is scaphoid. Bowel sounds are normal. There is no distension.     Palpations: Abdomen is soft. There is no hepatomegaly, splenomegaly or mass.     Tenderness: There is no abdominal tenderness. There is no guarding or rebound.  Genitourinary:    Comments: GU/rectal deferred at his request Musculoskeletal:        General: Normal range of motion.     Cervical back: Neck supple.     Right lower leg: No edema.     Left lower leg: No edema.  Lymphadenopathy:     Cervical: No cervical adenopathy.  Skin:    General: Skin is warm and dry.  Neurological:     General: No focal deficit present.     Mental Status: He is alert.  Psychiatric:        Mood and Affect: Mood normal.        Behavior: Behavior normal.     Lab Results  Component Value Date   WBC 5.5 06/28/2024   HGB 16.3 06/28/2024   HCT 46.7 06/28/2024   PLT 278.0 06/28/2024   GLUCOSE 89 06/28/2024   CHOL 92 06/28/2024   TRIG 154.0 (H) 06/28/2024   HDL  28.50 (L) 06/28/2024   LDLCALC 33 06/28/2024   ALT 29 06/28/2024   AST 21 06/28/2024   NA 137 06/28/2024   K 4.3 06/28/2024   CL 103 06/28/2024   CREATININE 1.16 06/28/2024   BUN 16 06/28/2024   CO2 29 06/28/2024   TSH 10.17 (H) 06/28/2024   PSA 1.09 06/28/2024   INR 1.0 12/07/2023   HGBA1C 5.3 06/19/2020     Estimated Creatinine Clearance: 81.1 mL/min (by C-G formula based on SCr of 1.16 mg/dL).   Assessment & Plan:  Screening for colon cancer -     Ambulatory referral to Gastroenterology  Prostate cancer screening -     PSA; Future  Need for hepatitis C screening test -     Hepatitis C antibody; Future  Ischemic cardiomyopathy -  Ambulatory referral to Cardiology  Idiopathic hypotension- His is hypothyroid. Other labs were normal. -     TSH; Future -     Urinalysis, Routine w reflex microscopic; Future -     Cortisol; Future -     CBC with Differential/Platelet; Future -     Basic metabolic panel with GFR; Future -     AMB Referral VBCI Care Management  Dyslipidemia, goal LDL below 55- LDL goal achieved. Doing well on the statin  -     Lipid panel; Future -     Hepatic function panel; Future  Encounter for general adult medical examination with abnormal findings- Exam completed, labs reviewed, vaccines reviewed (he refused them all), cancer screenings addressed, pt ed material was given.   Acquired hypothyroidism -     Unithroid ; Take 1 tablet (50 mcg total) by mouth daily before breakfast.  Dispense: 90 tablet; Refill: 0 -     AMB Referral VBCI Care Management  Tobacco abuse -     Ambulatory Referral for Lung Cancer Scre      Follow-up: Return in about 3 months (around 09/26/2024).  Debby Molt, MD "

## 2024-06-28 NOTE — Patient Instructions (Signed)
 Health Maintenance, Male  Adopting a healthy lifestyle and getting preventive care are important in promoting health and wellness. Ask your health care provider about:  The right schedule for you to have regular tests and exams.  Things you can do on your own to prevent diseases and keep yourself healthy.  What should I know about diet, weight, and exercise?  Eat a healthy diet    Eat a diet that includes plenty of vegetables, fruits, low-fat dairy products, and lean protein.  Do not eat a lot of foods that are high in solid fats, added sugars, or sodium.  Maintain a healthy weight  Body mass index (BMI) is a measurement that can be used to identify possible weight problems. It estimates body fat based on height and weight. Your health care provider can help determine your BMI and help you achieve or maintain a healthy weight.  Get regular exercise  Get regular exercise. This is one of the most important things you can do for your health. Most adults should:  Exercise for at least 150 minutes each week. The exercise should increase your heart rate and make you sweat (moderate-intensity exercise).  Do strengthening exercises at least twice a week. This is in addition to the moderate-intensity exercise.  Spend less time sitting. Even light physical activity can be beneficial.  Watch cholesterol and blood lipids  Have your blood tested for lipids and cholesterol at 51 years of age, then have this test every 5 years.  You may need to have your cholesterol levels checked more often if:  Your lipid or cholesterol levels are high.  You are older than 51 years of age.  You are at high risk for heart disease.  What should I know about cancer screening?  Many types of cancers can be detected early and may often be prevented. Depending on your health history and family history, you may need to have cancer screening at various ages. This may include screening for:  Colorectal cancer.  Prostate cancer.  Skin cancer.  Lung  cancer.  What should I know about heart disease, diabetes, and high blood pressure?  Blood pressure and heart disease  High blood pressure causes heart disease and increases the risk of stroke. This is more likely to develop in people who have high blood pressure readings or are overweight.  Talk with your health care provider about your target blood pressure readings.  Have your blood pressure checked:  Every 3-5 years if you are 24-52 years of age.  Every year if you are 3 years old or older.  If you are between the ages of 60 and 72 and are a current or former smoker, ask your health care provider if you should have a one-time screening for abdominal aortic aneurysm (AAA).  Diabetes  Have regular diabetes screenings. This checks your fasting blood sugar level. Have the screening done:  Once every three years after age 66 if you are at a normal weight and have a low risk for diabetes.  More often and at a younger age if you are overweight or have a high risk for diabetes.  What should I know about preventing infection?  Hepatitis B  If you have a higher risk for hepatitis B, you should be screened for this virus. Talk with your health care provider to find out if you are at risk for hepatitis B infection.  Hepatitis C  Blood testing is recommended for:  Everyone born from [birth year] through 1967.  Anyone  with known risk factors for hepatitis C.  Sexually transmitted infections (STIs)  You should be screened each year for STIs, including gonorrhea and chlamydia, if:  You are sexually active and are younger than 51 years of age.  You are older than 51 years of age and your health care provider tells you that you are at risk for this type of infection.  Your sexual activity has changed since you were last screened, and you are at increased risk for chlamydia or gonorrhea. Ask your health care provider if you are at risk.  Ask your health care provider about whether you are at high risk for HIV. Your health care provider  may recommend a prescription medicine to help prevent HIV infection. If you choose to take medicine to prevent HIV, you should first get tested for HIV. You should then be tested every 3 months for as long as you are taking the medicine.  Follow these instructions at home:  Alcohol use  Do not drink alcohol if your health care provider tells you not to drink.  If you drink alcohol:  Limit how much you have to 0-2 drinks a day.  Know how much alcohol is in your drink. In the [place] one drink equals one 12 oz bottle of beer (355 mL), one 5 oz glass of wine (148 mL), or one 1 oz glass of hard liquor (44 mL).  Lifestyle  Do not use any products that contain nicotine or tobacco. These products include cigarettes, chewing tobacco, and vaping devices, such as e-cigarettes. If you need help quitting, ask your health care provider.  Do not use street drugs.  Do not share needles.  Ask your health care provider for help if you need support or information about quitting drugs.  General instructions  Schedule regular health, dental, and eye exams.  Stay current with your vaccines.  Tell your health care provider if:  You often feel depressed.  You have ever been abused or do not feel safe at home.  Summary  Adopting a healthy lifestyle and getting preventive care are important in promoting health and wellness.  Follow your health care provider's instructions about healthy diet, exercising, and getting tested or screened for diseases.  Follow your health care provider's instructions on monitoring your cholesterol and blood pressure.  This information is not intended to replace advice given to you by your health care provider. Make sure you discuss any questions you have with your health care provider.  Document Revised: 10/21/2020 Document Reviewed: 10/21/2020  Elsevier Patient Education  2024 [employer].

## 2024-06-29 ENCOUNTER — Telehealth: Payer: Self-pay | Admitting: *Deleted

## 2024-06-29 LAB — HEPATITIS C ANTIBODY: Hepatitis C Ab: NONREACTIVE

## 2024-06-29 NOTE — Progress Notes (Signed)
 Care Guide Pharmacy Note  06/29/2024 Name: Martin Johnston MRN: [id number] DOB: 01-02-1976  Referred By: Patient, No Pcp Per Reason for referral: Call Attempt #1 and Complex Care Management (Outreach to schedule referral with pharmacist )   Martin Johnston is a 49 y.o. year old male who is a primary care patient of Patient, No Pcp Per.  Martin Johnston was referred to the pharmacist for assistance related to: hypothyroidism   An unsuccessful telephone outreach was attempted today to contact the patient who was referred to the pharmacy team for assistance with medication management. Additional attempts will be made to contact the patient.  [name], CMA, AAMA [place]  [facility], [facility] Guide, Lead Direct Dial: [phone]  Fax: [phone]

## 2024-06-30 NOTE — Progress Notes (Signed)
 Care Guide Pharmacy Note  06/30/2024 Name: [name] MRN: [id number] DOB: [birth year]  Referred By: Patient, No Pcp Per Reason for referral: Call Attempt #1 and Complex Care Management (Outreach to schedule referral with pharmacist )   Martin Johnston is a 49 y.o. year old male who is a primary care patient of Patient, No Pcp Per.  LEROY PETTWAY was referred to the pharmacist for assistance related to: hypothyroidism   Successful contact was made with the patient to discuss pharmacy services including being ready for the pharmacist to call at least 5 minutes before the scheduled appointment time and to have medication bottles and any blood pressure readings ready for review. The patient agreed to meet with the pharmacist via telephone visit on 07/12/2024.  [name], CMA, AAMA   [facility], [facility] Guide, Lead Direct Dial: [phone]  Fax: [phone]

## 2024-07-05 NOTE — Progress Notes (Unsigned)
 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    Martin Johnston established care in 06/2020 after anterolateral STEMI with DES placed to his proximal LAD. He was started on DAPT with aspirin  and Brilinta  for one year. Echo at that time showed LVEF 40-45%. GDMT was limited by blood pressure. Follow-up echo 08/2020 showed LVEF had improved to 50-55%.   Brilinta  was decreased to 60 mg twice daily during visit 06/2021. This was subsequently discontinued during visit 10/2022.   Unfortunately he presented back to the ER 12/06/2023 with chest pain. EKG without ST elevation. He was ruled in for NSTEMI. Cardiac catheterization 12/07/2023 showed stenosis of the RCA for which a DES was successfully placed. His prior stent to the LAD was patent. Echo same day showed LVEF 55-60%. It was recommended that he be on DAPT with aspirin  and Brilinta  for six months to one year followed by Brilinta  monotherapy indefinitely thereafter.    History of Present Illness: Today he is doing okay, but has had episodes of weakness once or twice per week for the past month or so. He notices it around lunchtime. He sits down for lunch and when he stands up to go back to work, he feels that it takes him around 15 minutes to get going. He does not eat breakfast. He has not checked his blood sugar. His PCP told him that his BP was low, I see that it was 104/64, which is similar to today's reading at 104/70. He does not check this at  home. He does have mild dyspnea with lifting something heavy, but otherwise denies shortness of breath. He is smoking 1/2 pack of cigarettes per day. He has had a couple of nosebleeds that he was able to stop quickly.  ROS: Denies chest pain, orthopnea, PND, lower extremity edema, palpitations, lightheadedness, dizziness, syncope, abnormal bleeding.   Studies Reviewed: The following studies were reviewed today: Cardiac Studies & Procedures   ______________________________________________________________________________________________ CARDIAC CATHETERIZATION  CARDIAC CATHETERIZATION 12/07/2023  Conclusion   Mid LAD lesion is 30% stenosed.   Prox RCA to Mid RCA lesion is 95% stenosed.   Non-stenotic Prox LAD to Mid LAD lesion was previously treated.   A stent was successfully placed.   Post intervention, there is a 0% residual stenosis.  1.  New high-grade mid right coronary artery lesion treated with 1 drug-eluting stent. 2.  Widely patent proximal LAD stent with mild disease elsewhere. 3.  LVEDP of 8 mmHg.  Recommendation: Dual antiplatelet therapy for at least 6 months and preferably 1 year with aspirin  and Brilinta  followed by indefinite Brilinta  monotherapy.  Judicious IV fluid resuscitation.  The results were reviewed with the patient's mother.  Findings Coronary Findings Diagnostic  Dominance: Right  Left Anterior Descending Non-stenotic Prox LAD to Mid LAD lesion was previously treated. The lesion is heavily thrombotic. Mid LAD lesion is 30% stenosed.  Left Circumflex Vessel is moderate in size. The vessel exhibits minimal luminal irregularities.  Right Coronary Artery Vessel  is large. Vessel is angiographically normal. Large, dominant vessel with minimal irregularity Prox RCA to Mid RCA lesion is 95% stenosed.  Intervention  Prox RCA to Mid RCA lesion Stent A stent was successfully placed. Post-Intervention Lesion Assessment The intervention was successful.  Pre-interventional TIMI flow is 3. Post-intervention TIMI flow is 3. There is a 0% residual stenosis post intervention.   CARDIAC CATHETERIZATION  CARDIAC CATHETERIZATION 06/19/2020  Conclusion  Prox LAD to Mid LAD lesion is 70% stenosed.  Mid LAD lesion is 50% stenosed.  A drug-eluting stent was successfully placed using a STENT RESOLUTE ONYX 3.5X15.  Post intervention, there is a 0% residual stenosis.  1.  Severe single-vessel coronary artery disease with thrombotic proximal LAD lesion, treated successfully with direct stenting using a 3.5 x 15 mm resolute Onyx DES, postdilated with a 3.75 mm noncompliant balloon with intravascular ultrasound guidance 2.  Widely patent left mainstem, left circumflex, and RCA with mild nonobstructive plaquing noted 3.  Moderate nonobstructive mid LAD stenosis after the second diagonal branch, appropriate for medical therapy  Recommendations: Aggrastat  x6 hours Patient loaded with aspirin  324 mg and ticagrelor  180 mg in the Cath Lab 2D echocardiogram Medical therapy and tobacco cessation Hospital discharge 24 to 48 hours depending on assessment of LV function and post MI clinical course.  Findings Coronary Findings Diagnostic  Dominance: Right  Left Anterior Descending Prox LAD to Mid LAD lesion is 70% stenosed. The lesion is heavily thrombotic. Mid LAD lesion is 50% stenosed.  Left Circumflex Vessel is moderate in size. The vessel exhibits minimal luminal irregularities.  Right Coronary Artery Vessel is large. The vessel exhibits minimal luminal irregularities. Large, dominant vessel with minimal irregularity  Intervention  Prox LAD to Mid LAD lesion Stent CATH LAUNCHER 6FR EBU3.5 guide catheter was inserted. Lesion crossed with guidewire using a WIRE COUGAR XT STRL 190CM. Pre-stent angioplasty was not performed. A drug-eluting stent was successfully placed using a STENT RESOLUTE ONYX 3.5X15. Post-stent angioplasty was performed using a  BALLOON SAPPHIRE Carlyss [id number]. Maximum pressure:  16 atm. Post-Intervention Lesion Assessment The intervention was successful. Pre-interventional TIMI flow is 3. Post-intervention TIMI flow is 3. No complications occurred at this lesion. Ultrasound (IVUS) was performed on the lesion post PCI. Stent well expanded. Moderate plaque burden detected. Lesion characteristics:  calcified and eccentric. An additional ultrasound (IVUS) was not performed. There is a 0% residual stenosis post intervention.     ECHOCARDIOGRAM  ECHOCARDIOGRAM COMPLETE 12/07/2023  Narrative ECHOCARDIOGRAM REPORT    Patient Name:   RAYMONDO GARCIALOPEZ [facility] Date of Exam: 12/07/2023 Medical Rec #:  [id number]          Height:       72.0 in Accession #:    [id number]         Weight:       175.0 lb Date of Birth:  July 28, 1975          BSA:          2.013 m Patient Age:    47 years           BP:           112/67 mmHg Patient Gender: M                  HR:           58 bpm. Exam Location:  Inpatient  Procedure: 2D Echo, Cardiac Doppler and Color Doppler (Both Spectral and Color Flow Doppler were utilized during procedure).  Indications:  chest pain  History:        Patient has prior history of Echocardiogram examinations, most recent 08/15/2020.  Sonographer:    Benard Stallion Referring Phys: [id number] MICHAEL COSIANO  IMPRESSIONS   1. Left ventricular ejection fraction, by estimation, is 55 to 60%. The left ventricle has normal function. The left ventricle has no regional wall motion abnormalities. Left ventricular diastolic parameters were normal. 2. Right ventricular systolic function is normal. The right ventricular size is normal. 3. The mitral valve is normal in structure. No evidence of mitral valve regurgitation. No evidence of mitral stenosis. 4. 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 The aortic valve is bicuspid. Aortic valve regurgitation is not visualized. No aortic stenosis is present. 5. The  inferior vena cava is normal in size with greater than 50% respiratory variability, suggesting right atrial pressure of 3 mmHg.  Comparison(s): Prior images reviewed side by side. Left ventricle is slightly more vigorous.  FINDINGS Left Ventricle: Left ventricular ejection fraction, by estimation, is 55 to 60%. The left ventricle has normal function. The left ventricle has no regional wall motion abnormalities. The left ventricular internal cavity size was normal in size. There is no left ventricular hypertrophy. Left ventricular diastolic parameters were normal.  Right Ventricle: The right ventricular size is normal. No increase in right ventricular wall thickness. Right ventricular systolic function is normal.  Left Atrium: Left atrial size was normal in size.  Right Atrium: Right atrial size was normal in size.  Pericardium: There is no evidence of pericardial effusion.  Mitral Valve: The mitral valve is normal in structure. No evidence of mitral valve regurgitation. No evidence of mitral valve stenosis.  Tricuspid Valve: The tricuspid valve is normal in structure. Tricuspid valve regurgitation is not demonstrated. No evidence of tricuspid stenosis.  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 The aortic valve is bicuspid. Aortic valve regurgitation is not visualized. No aortic stenosis is present. Aortic valve mean gradient measures 4.0 mmHg. Aortic valve peak gradient measures 7.5 mmHg. Aortic valve area, by VTI measures 2.57 cm.  Pulmonic Valve: The pulmonic valve was normal in structure. Pulmonic valve regurgitation is trivial. No evidence of pulmonic stenosis.  Aorta: The aortic root and ascending aorta are structurally normal, with no evidence of dilitation.  Venous: The inferior vena cava is normal in size with greater than 50% respiratory variability, suggesting right atrial pressure of 3 mmHg.  IAS/Shunts: The atrial septum is grossly normal.   LEFT  VENTRICLE PLAX 2D LVIDd:         4.50 cm   Diastology LVIDs:         3.10 cm   LV e' medial:    8.49 cm/s LV PW:         1.00 cm   LV E/e' medial:  7.6 LV IVS:        1.00 cm   LV e' lateral:   11.10 cm/s LVOT diam:     2.30 cm   LV E/e' lateral: 5.8 LV SV:         77 LV SV Index:   38 LVOT Area:     4.15 cm   RIGHT VENTRICLE RV Basal diam:  2.90 cm RV Mid diam:    3.30 cm RV S prime:     9.03 cm/s TAPSE (M-mode): 1.6 cm  LEFT ATRIUM             Index        RIGHT ATRIUM  Index LA diam:        3.10 cm 1.54 cm/m   RA Area:     13.70 cm LA Vol (A2C):   29.1 ml 14.45 ml/m  RA Volume:   28.40 ml  14.11 ml/m LA Vol (A4C):   24.2 ml 12.02 ml/m LA Biplane Vol: 27.3 ml 13.56 ml/m AORTIC VALVE AV Area (Vmax):    2.46 cm AV Area (Vmean):   2.37 cm AV Area (VTI):     2.57 cm AV Vmax:           137.00 cm/s AV Vmean:          93.700 cm/s AV VTI:            0.299 m AV Peak Grad:      7.5 mmHg AV Mean Grad:      4.0 mmHg LVOT Vmax:         81.20 cm/s LVOT Vmean:        53.400 cm/s LVOT VTI:          0.185 m LVOT/AV VTI ratio: 0.62  AORTA Ao Root diam: 3.30 cm Ao Asc diam:  3.60 cm  MITRAL VALVE MV Area (PHT): 3.03 cm    SHUNTS MV Decel Time: 250 msec    Systemic VTI:  0.18 m MV E velocity: 64.90 cm/s  Systemic Diam: 2.30 cm MV A velocity: 53.40 cm/s MV E/A ratio:  1.22  Stanly Leavens MD Electronically signed by Stanly Leavens MD Signature Date/Time: 12/07/2023/2:03:58 PM    Final          ______________________________________________________________________________________________                        Physical Exam: VS: BP 104/70   Pulse 78   Ht 6' (1.829 m)   Wt 180 lb (81.6 kg)   SpO2 99%   BMI 24.41 kg/m   GEN:  Well nourished, in NAD HEENT: Normal NECK: No JVD CARDIAC: RRR, no murmurs, rubs, gallops RESPIRATORY: Lung sounds diminished in the bases bilaterally ABDOMEN: Soft, non-tender,  non-distended MUSCULOSKELETAL: No edema SKIN: Warm and dry NEUROLOGIC:  Alert and oriented x 3 PSYCHIATRIC:  Pleasant, normal affect   Assessment & Plan: 1. Weakness: He has had episodes of weakness at lunch around once or twice per week for the past month. He does not eat breakfast and doesn't check his blood sugar. The episodes could be related to hypoglycemia, but his BP is also on the lower end of normal both here and at his PCP. He denies true dizziness, lightheadedness, and presyncope, but feels that he just can't get going. Symptoms improve once he is up and moving around for about 15 minutes. - Will discontinue lisinopril  for now - he is only on 2.5 mg daily - advised to monitor his BP at home and contact the office if it is consistently greater than 130/90  2. CAD: S/p STEMI with DES placed to the LAD in 06/2020 and NSTEMI with DES placed to the RCA in 11/2023. He does have mild dyspnea with lifting heavy but denies chest pain.  - Continue aspirin  81 mg daily and Brilinta  90 mg twice daily until at least 11/2024 followed by Brilinta  monotherapy thereafter - Continue atorvastatin  80 mg daily and ezetimibe  10 mg daily - Continue Toprol -XL 25 mg daily - Continue as needed SL nitroglycerin   3. Hyperlipidemia, goal LDL < 55:  06/28/2024 LDL 33, HDL 28, TGs 154, total 92. 12/08/2023 LPA 224. LDL  is at goal, but LPA is significantly elevated. He could benefit from a PCSK9i. He is active at work but admits to not following the best diet and he does still smoke.  - Refer to pharmD in lipid clinic for PCSK9i consideration - Continue atorvastatin  80 mg daily and ezetimibe  10 mg daily  4. HFimpEF/ICM: LVEF decreased to 40-45% in the setting of STEMI in 06/2020 but improved to 50-55% on follow-up echo 08/2020. Stable at 55-60% on echo in the setting of his NSTEMI in 11/2023. He does have mild chronic dyspnea with lifting something heavy, but denies HF symptoms otherwise. Appears euvolemic on exam today. -  Continue Toprol -XL 25 mg daily  5. Tobacco use: He is smoking 1/2 PPD and wishes to stop. His wife smokes 2 PPD and this makes it difficult. - Discussed options for helping him quit - he has quit without assistance in the past and would like to do this again  Dispo: Follow-up in 6 months with APP.  Signed, Saddie GORMAN Cleaves, NP 07/06/2024 10:31 AM [facility] "

## 2024-07-06 ENCOUNTER — Encounter: Payer: Self-pay | Admitting: General Practice

## 2024-07-06 ENCOUNTER — Ambulatory Visit: Attending: General Practice

## 2024-07-06 VITALS — BP 104/70 | HR 78 | Ht 72.0 in | Wt 180.0 lb

## 2024-07-06 DIAGNOSIS — E782 Mixed hyperlipidemia: Secondary | ICD-10-CM

## 2024-07-06 DIAGNOSIS — I255 Ischemic cardiomyopathy: Secondary | ICD-10-CM | POA: Diagnosis not present

## 2024-07-06 DIAGNOSIS — I2510 Atherosclerotic heart disease of native coronary artery without angina pectoris: Secondary | ICD-10-CM

## 2024-07-06 DIAGNOSIS — Z720 Tobacco use: Secondary | ICD-10-CM | POA: Diagnosis not present

## 2024-07-06 NOTE — Patient Instructions (Addendum)
 Medication Instructions:  STOP Lisinopril  *If you need a refill on your cardiac medications before your next appointment, please call your pharmacy*  Lab Work: None ordered If you have labs (blood work) drawn today and your tests are completely normal, you will receive your results only by: MyChart Message (if you have MyChart) OR A paper copy in the mail If you have any lab test that is abnormal or we need to change your treatment, we will call you to review the results.  Testing/Procedures: None ordered  Follow-Up: At [facility] you and your health needs are our priority.  As part of our continuing mission to provide you with exceptional heart care, our providers are all part of one team.  This team includes your primary Cardiologist (physician) and Advanced Practice Providers or APPs (Physician Assistants and Nurse Practitioners) who all work together to provide you with the care you need, when you need it.  Your next appointment:   6 month(s)  Provider:   Katlyn West, NP or Glendia Ferrier, [place]   We recommend signing up for the patient portal called MyChart.  Sign up information is provided on this After Visit Summary.  MyChart is used to connect with patients for Virtual Visits (Telemedicine).  Patients are able to view lab/test results, encounter notes, upcoming appointments, etc.  Non-urgent messages can be sent to your provider as well.   To learn more about what you can do with MyChart, go to [email or web address].   Other Instructions You have been referred to Pharm-D (lipids)

## 2024-07-12 ENCOUNTER — Other Ambulatory Visit: Admitting: Pharmacist

## 2024-07-12 DIAGNOSIS — E039 Hypothyroidism, unspecified: Secondary | ICD-10-CM

## 2024-07-12 NOTE — Patient Instructions (Signed)
 It was a pleasure speaking with you today!  Continue your current regimen.  We will recheck your thyroid  level in about 1 month.  Get your blood pressure checked at home to make sure your blood pressure is staying below 130/80 since stopping lisinopril .  Feel free to call with any questions or concerns!  Darrelyn Drum, PharmD, BCPS, CPP Clinical Pharmacist Practitioner [facility] at [facility] [phone]

## 2024-07-12 NOTE — Progress Notes (Signed)
 "  07/12/2024 Name: Martin Johnston MRN: [id number] DOB: 1975-11-19  Chief Complaint  Patient presents with   Hypertension   Hypothyroidism   Medication Management    Martin Johnston is a 49 y.o. year old male who presented for a telephone visit.   They were referred to the pharmacist by their PCP for assistance in managing hypertension and hypothyroidism.    Subjective:  Care Team: Primary Care Provider: Joshua Debby CROME, MD ; Next Scheduled Visit: none scheduled  Medication Access/Adherence  Current Pharmacy:  [facility] [address], [place] - [address] [name] [address] Phone: [phone] Fax: [phone]  [facility] [address] [place] [address] Phone: [phone] Fax: [phone]   Patient reports affordability concerns with their medications: No  Patient reports access/transportation concerns to their pharmacy: No  Patient reports adherence concerns with their medications:  No     Hypertension: Pt confirms he did stop lisinopril  after cardio appt. He feels symptoms have improved somewhat, but not completely. He does not have a BP monitor at home, noting he needs to get one.  Current medications: metoprolol  succinate 25 mg daily (for hx ASCVD) Medications previously tried: lisinopril  2.5 mg (d/c by cardio due to hypotension)  Patient does not have a validated, automated, upper arm home BP cuff Current blood pressure readings: n/a  Patient denies hypotensive s/sx including dizziness, lightheadedness.  Patient denies hypertensive symptoms including headache, chest pain, shortness of breath   Hypothyroidism: Current medication: Unithroid  50 mcg every morning (started 06/30/24)   Objective:  Lab Results  Component Value Date   HGBA1C 5.3 06/19/2020    Lab Results  Component Value Date   CREATININE 1.16 06/28/2024   BUN 16 06/28/2024   NA 137 06/28/2024   K  4.3 06/28/2024   CL 103 06/28/2024   CO2 29 06/28/2024    Lab Results  Component Value Date   CHOL 92 06/28/2024   HDL 28.50 (L) 06/28/2024   LDLCALC 33 06/28/2024   TRIG 154.0 (H) 06/28/2024   CHOLHDL 3 06/28/2024    Medications Reviewed Today     Reviewed by Merceda Lela JONELLE, RPH-CPP (Pharmacist) on 07/12/24 at [phone]  Med List Status: <None>   Medication Order Taking? Sig Documenting Provider Last Dose Status Informant  Acetaminophen  (TYLENOL  PO) [id number] Yes Take 2 tablets by mouth daily as needed (pain/headaches). [provider]  Active Self, Pharmacy Records           Med Note (CRUTHIS, CHLOE C   Tue Dec 07, 2023  9:56 AM) Pt is unsure what dose of this medication he has at home.   aspirin  EC 81 MG tablet [id number] Yes Take 1 tablet (81 mg total) by mouth daily. Swallow whole. Madie Jon Garre, PA  Active   atorvastatin  (LIPITOR ) 80 MG tablet [id number] Yes Take 1 tablet (80 mg total) by mouth daily. West, Katlyn D, NP  Active   ezetimibe  (ZETIA ) 10 MG tablet [id number] Yes Take 1 tablet (10 mg total) by mouth daily. West, Katlyn D, NP  Active   metoprolol  succinate (TOPROL -XL) 25 MG 24 hr tablet [id number] Yes Take 1 tablet (25 mg total) by mouth daily. West, Katlyn D, NP  Active   nitroGLYCERIN  (NITROSTAT ) 0.4 MG SL tablet [id number] Yes Place 1 tablet (0.4 mg total) under the tongue every 5 (five) minutes x 3 doses as needed for chest pain. West, Katlyn D, NP  Active   ticagrelor  (BRILINTA )  90 MG TABS tablet [id number] Yes Take 1 tablet (90 mg total) by mouth 2 (two) times daily. West, Katlyn D, NP  Active   UNITHROID  50 MCG tablet [id number] Yes Take 1 tablet (50 mcg total) by mouth daily before breakfast. Joshua Debby CROME, MD  Active               Assessment/Plan:   Hypertension: - Currently controlled, BP goal <130/80 - Reviewed long term cardiovascular and renal outcomes of uncontrolled blood pressure - Recommended to check home blood pressure and heart  rate - recommended getting arm monitor or checking BP at a local pharmacy to ensure BP has not become elevated since discontinuation - Recommend to continue current regimen.  Hypothyroidism: Current uncontrolled, Goal TSH <4.5 Continue current regimen Pt to come around 2/23 for TSH re-check    Follow Up Plan: 2/23 walk in labs  Darrelyn Drum, PharmD, BCPS, CPP Clinical Pharmacist Practitioner [facility] [phone]    "

## 2024-08-14 ENCOUNTER — Ambulatory Visit: Admitting: Pharmacist Clinician (PhC)/ Clinical Pharmacy Specialist
